# Patient Record
Sex: Female | Born: 1945
Health system: Southern US, Community
[De-identification: ages and names within clinical notes are randomized; demographics above are authoritative.]

## PROBLEM LIST (undated history)

## (undated) DIAGNOSIS — K259 Gastric ulcer, unspecified as acute or chronic, without hemorrhage or perforation: Secondary | ICD-10-CM

## (undated) DIAGNOSIS — I2510 Atherosclerotic heart disease of native coronary artery without angina pectoris: Secondary | ICD-10-CM

## (undated) DIAGNOSIS — T7840XA Allergy, unspecified, initial encounter: Secondary | ICD-10-CM

## (undated) DIAGNOSIS — I10 Essential (primary) hypertension: Secondary | ICD-10-CM

## (undated) DIAGNOSIS — K219 Gastro-esophageal reflux disease without esophagitis: Secondary | ICD-10-CM

## (undated) DIAGNOSIS — K635 Polyp of colon: Secondary | ICD-10-CM

## (undated) DIAGNOSIS — R079 Chest pain, unspecified: Secondary | ICD-10-CM

## (undated) HISTORY — DX: Allergy, unspecified, initial encounter: T78.40XA

## (undated) HISTORY — PX: NASAL SINUS SURGERY: SHX719

## (undated) HISTORY — DX: Chest pain, unspecified: R07.9

---
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
## 1982-12-14 HISTORY — PX: ABDOMINAL HYSTERECTOMY: SHX81

## 1993-12-14 HISTORY — PX: HEMORROIDECTOMY: SUR656

## 1999-12-03 ENCOUNTER — Encounter: Payer: Self-pay | Admitting: Obstetrics and Gynecology

## 1999-12-03 ENCOUNTER — Encounter: Admission: RE | Admit: 1999-12-03 | Discharge: 1999-12-03 | Payer: Self-pay | Admitting: Obstetrics and Gynecology

## 2000-01-20 ENCOUNTER — Other Ambulatory Visit: Admission: RE | Admit: 2000-01-20 | Discharge: 2000-01-20 | Payer: Self-pay | Admitting: Obstetrics and Gynecology

## 2001-01-03 ENCOUNTER — Encounter: Payer: Self-pay | Admitting: Obstetrics and Gynecology

## 2001-01-03 ENCOUNTER — Encounter: Admission: RE | Admit: 2001-01-03 | Discharge: 2001-01-03 | Payer: Self-pay | Admitting: Obstetrics and Gynecology

## 2002-03-22 ENCOUNTER — Encounter: Admission: RE | Admit: 2002-03-22 | Discharge: 2002-03-22 | Payer: Self-pay | Admitting: Obstetrics and Gynecology

## 2002-03-22 ENCOUNTER — Encounter: Payer: Self-pay | Admitting: Obstetrics and Gynecology

## 2003-03-06 ENCOUNTER — Other Ambulatory Visit: Admission: RE | Admit: 2003-03-06 | Discharge: 2003-03-06 | Payer: Self-pay | Admitting: Obstetrics and Gynecology

## 2003-05-25 ENCOUNTER — Encounter: Payer: Self-pay | Admitting: Obstetrics and Gynecology

## 2003-05-25 ENCOUNTER — Encounter: Admission: RE | Admit: 2003-05-25 | Discharge: 2003-05-25 | Payer: Self-pay | Admitting: Obstetrics and Gynecology

## 2004-12-17 ENCOUNTER — Encounter: Admission: RE | Admit: 2004-12-17 | Discharge: 2004-12-17 | Payer: Self-pay | Admitting: Obstetrics and Gynecology

## 2004-12-31 ENCOUNTER — Encounter: Admission: RE | Admit: 2004-12-31 | Discharge: 2004-12-31 | Payer: Self-pay | Admitting: Obstetrics and Gynecology

## 2006-01-26 ENCOUNTER — Ambulatory Visit: Payer: Self-pay | Admitting: Gastroenterology

## 2006-02-22 ENCOUNTER — Ambulatory Visit: Payer: Self-pay | Admitting: Unknown Physician Specialty

## 2006-04-21 ENCOUNTER — Encounter: Admission: RE | Admit: 2006-04-21 | Discharge: 2006-04-21 | Payer: Self-pay | Admitting: Obstetrics and Gynecology

## 2006-05-19 ENCOUNTER — Encounter: Admission: RE | Admit: 2006-05-19 | Discharge: 2006-05-19 | Payer: Self-pay | Admitting: Obstetrics and Gynecology

## 2006-11-02 ENCOUNTER — Ambulatory Visit: Payer: Self-pay | Admitting: Gastroenterology

## 2006-11-17 ENCOUNTER — Ambulatory Visit: Payer: Self-pay | Admitting: Gastroenterology

## 2006-12-30 ENCOUNTER — Ambulatory Visit: Payer: Self-pay | Admitting: Gastroenterology

## 2007-10-12 ENCOUNTER — Encounter: Admission: RE | Admit: 2007-10-12 | Discharge: 2007-10-12 | Payer: Self-pay | Admitting: Family Medicine

## 2009-02-22 ENCOUNTER — Encounter: Admission: RE | Admit: 2009-02-22 | Discharge: 2009-02-22 | Payer: Self-pay | Admitting: Family Medicine

## 2010-12-04 LAB — CBC AND DIFFERENTIAL: Hemoglobin: 12 g/dL (ref 12.0–16.0)

## 2010-12-04 LAB — HEPATIC FUNCTION PANEL: ALT: 12 U/L (ref 7–35)

## 2010-12-14 LAB — HM MAMMOGRAPHY

## 2010-12-14 LAB — HM PAP SMEAR

## 2010-12-16 ENCOUNTER — Ambulatory Visit: Payer: Self-pay | Admitting: Gastroenterology

## 2010-12-18 LAB — PATHOLOGY REPORT

## 2011-01-03 ENCOUNTER — Encounter: Payer: Self-pay | Admitting: Obstetrics and Gynecology

## 2011-01-04 ENCOUNTER — Encounter: Payer: Self-pay | Admitting: Family Medicine

## 2011-01-04 ENCOUNTER — Encounter: Payer: Self-pay | Admitting: Obstetrics and Gynecology

## 2011-03-02 ENCOUNTER — Ambulatory Visit: Payer: Self-pay | Admitting: Internal Medicine

## 2011-08-31 ENCOUNTER — Other Ambulatory Visit (INDEPENDENT_AMBULATORY_CARE_PROVIDER_SITE_OTHER): Payer: Medicare Other | Admitting: *Deleted

## 2011-08-31 ENCOUNTER — Telehealth: Payer: Self-pay | Admitting: Internal Medicine

## 2011-08-31 DIAGNOSIS — N390 Urinary tract infection, site not specified: Secondary | ICD-10-CM

## 2011-08-31 LAB — POCT URINALYSIS DIPSTICK
Glucose, UA: NEGATIVE
Ketones, UA: NEGATIVE
Nitrite, UA: NEGATIVE
Protein, UA: NEGATIVE
Urobilinogen, UA: 0.2

## 2011-08-31 NOTE — Telephone Encounter (Signed)
Patient says that she has been having burning, and urgency for about a week. I scheduled her a lab appt to give urine specimen. I entered the results for the UA. And I have sent it off for culture.

## 2011-08-31 NOTE — Telephone Encounter (Signed)
Please confirm no sulfa allergy and call in Bactrim DS tab po bid x 7 days, disp 14. Thanks

## 2011-08-31 NOTE — Telephone Encounter (Signed)
Pt walked in wanting to be see for uti.  This has been going on for 1 week/rbh

## 2011-09-01 ENCOUNTER — Other Ambulatory Visit: Payer: Self-pay | Admitting: *Deleted

## 2011-09-01 MED ORDER — SULFAMETHOXAZOLE-TRIMETHOPRIM 800-160 MG PO TABS: 1.0000 | ORAL_TABLET | Freq: Two times a day (BID) | ORAL | Status: AC

## 2011-09-01 MED ORDER — SULFAMETHOXAZOLE-TRIMETHOPRIM 800-160 MG PO TABS: 1.0000 | ORAL_TABLET | Freq: Two times a day (BID) | ORAL | Status: DC

## 2011-09-01 NOTE — Telephone Encounter (Signed)
Addended by: Jobie Quaker on: 09/01/2011 12:42 PM   Modules accepted: Orders

## 2011-09-01 NOTE — Telephone Encounter (Signed)
Per patient she has not allergy to sulfa. Rx sent to pharmacy.

## 2011-09-01 NOTE — Telephone Encounter (Signed)
Left message asking patient to return my call.

## 2011-09-03 LAB — URINE CULTURE: Colony Count: 60000

## 2011-12-21 DIAGNOSIS — J301 Allergic rhinitis due to pollen: Secondary | ICD-10-CM | POA: Diagnosis not present

## 2011-12-28 DIAGNOSIS — J301 Allergic rhinitis due to pollen: Secondary | ICD-10-CM | POA: Diagnosis not present

## 2012-01-04 DIAGNOSIS — J301 Allergic rhinitis due to pollen: Secondary | ICD-10-CM | POA: Diagnosis not present

## 2012-01-18 DIAGNOSIS — J301 Allergic rhinitis due to pollen: Secondary | ICD-10-CM | POA: Diagnosis not present

## 2012-01-25 DIAGNOSIS — J301 Allergic rhinitis due to pollen: Secondary | ICD-10-CM | POA: Diagnosis not present

## 2012-02-01 DIAGNOSIS — J301 Allergic rhinitis due to pollen: Secondary | ICD-10-CM | POA: Diagnosis not present

## 2012-02-08 DIAGNOSIS — J301 Allergic rhinitis due to pollen: Secondary | ICD-10-CM | POA: Diagnosis not present

## 2012-02-15 DIAGNOSIS — J301 Allergic rhinitis due to pollen: Secondary | ICD-10-CM | POA: Diagnosis not present

## 2012-02-22 ENCOUNTER — Encounter: Payer: Self-pay | Admitting: Internal Medicine

## 2012-02-22 ENCOUNTER — Ambulatory Visit (INDEPENDENT_AMBULATORY_CARE_PROVIDER_SITE_OTHER): Payer: Medicare Other | Admitting: Internal Medicine

## 2012-02-22 VITALS — BP 122/62 | HR 76 | Temp 98.0°F | Wt 120.0 lb

## 2012-02-22 DIAGNOSIS — N644 Mastodynia: Secondary | ICD-10-CM

## 2012-02-22 DIAGNOSIS — E039 Hypothyroidism, unspecified: Secondary | ICD-10-CM

## 2012-02-22 DIAGNOSIS — D649 Anemia, unspecified: Secondary | ICD-10-CM

## 2012-02-22 DIAGNOSIS — E785 Hyperlipidemia, unspecified: Secondary | ICD-10-CM | POA: Diagnosis not present

## 2012-02-22 DIAGNOSIS — J301 Allergic rhinitis due to pollen: Secondary | ICD-10-CM | POA: Diagnosis not present

## 2012-02-22 NOTE — Progress Notes (Signed)
  Subjective:    Patient ID: Natalie [name], female    DOB: July 25, 1946, 66 y.o.   MRN: [id number]  HPI 66 year old female presents for acute visit complaining of three-week history of right breast pain. She denies any known injury to her breasts. She has no pain at rest but notes pain with movement or pressure on the right breast. The pain starts in the lower lateral right breast and extends upward towards her nipple. There is no overlying skin change or discharge from the nipple. She has palpated a mass in the right lower breast, but reports this is been present for several years. She notes it was evaluated with diagnostic mammogram which was normal.  Outpatient Encounter Prescriptions as of 02/22/2012  Medication Sig Dispense Refill  . aspirin EC 81 MG tablet Take 81 mg by mouth daily.      . budesonide-formoterol (SYMBICORT) 80-4.5 MCG/ACT inhaler Inhale 2 puffs into the lungs 2 (two) times daily as needed.      Marland Kitchen estrogens, conjugated, (PREMARIN) 0.3 MG tablet Take 0.3 mg by mouth daily. Take daily for 21 days then do not take for 7 days.      . fexofenadine (ALLEGRA) 180 MG tablet Take 180 mg by mouth daily.      . fish oil-omega-3 fatty acids 1000 MG capsule Take 2 g by mouth daily.      . fluticasone (FLONASE) 50 MCG/ACT nasal spray Place 2 sprays into the nose daily.      Marland Kitchen omeprazole (PRILOSEC OTC) 20 MG tablet Take 20 mg by mouth daily as needed.      . tiotropium (SPIRIVA) 18 MCG inhalation capsule Place 18 mcg into inhaler and inhale daily as needed.        Review of Systems  Constitutional: Negative for fever, chills and fatigue.  Cardiovascular: Positive for chest pain.  Skin: Negative for color change, pallor, rash and wound.   BP 122/62  Pulse 76  Temp(Src) 98 F (36.7 C) (Oral)  Wt 120 lb (54.432 kg)  SpO2 100%     Objective:   Physical Exam  Constitutional: She appears well-developed and well-nourished. No distress.  HENT:  Head: Normocephalic and atraumatic.    Eyes: EOM are normal.  Neck: Normal range of motion. Neck supple.  Pulmonary/Chest: Effort normal. Right breast exhibits mass and tenderness. Right breast exhibits no inverted nipple, no nipple discharge and no skin change. Left breast exhibits no inverted nipple, no mass, no nipple discharge, no skin change and no tenderness. Breasts are symmetrical.    Skin: She is not diaphoretic.          Assessment & Plan:

## 2012-02-22 NOTE — Assessment & Plan Note (Signed)
Suspect pain secondary to fibrocystic changes. Will get diagnostic mammogram of the right breast for further evaluation. We'll also get screening mammogram of the left breast. Will get ultrasound of the right breast for further evaluation of palpable nodule. However, patient reports that this has been present for several years and has been evaluated by a surgeon in the past. Patient has followup in 2 weeks to discuss results.

## 2012-02-23 LAB — CBC WITH DIFFERENTIAL/PLATELET
Basophils Relative: 0.7 % (ref 0.0–3.0)
Eosinophils Absolute: 0.3 10*3/uL (ref 0.0–0.7)
Eosinophils Relative: 3.2 % (ref 0.0–5.0)
HCT: 35.5 % — ABNORMAL LOW (ref 36.0–46.0)
Hemoglobin: 12.1 g/dL (ref 12.0–15.0)
Lymphocytes Relative: 21.2 % (ref 12.0–46.0)
Monocytes Absolute: 0.7 10*3/uL (ref 0.1–1.0)
Monocytes Relative: 8.3 % (ref 3.0–12.0)
Neutro Abs: 5.5 10*3/uL (ref 1.4–7.7)
Neutrophils Relative %: 66.6 % (ref 43.0–77.0)
Platelets: 390 10*3/uL (ref 150.0–400.0)

## 2012-02-23 LAB — COMPREHENSIVE METABOLIC PANEL
ALT: 15 U/L (ref 0–35)
AST: 21 U/L (ref 0–37)
BUN: 13 mg/dL (ref 6–23)
Calcium: 9.4 mg/dL (ref 8.4–10.5)
Glucose, Bld: 89 mg/dL (ref 70–99)

## 2012-02-23 LAB — LIPID PANEL
Cholesterol: 184 mg/dL (ref 0–200)
LDL Cholesterol: 87 mg/dL (ref 0–99)
Triglycerides: 84 mg/dL (ref 0.0–149.0)

## 2012-02-23 LAB — TSH: TSH: 1.7 u[IU]/mL (ref 0.35–5.50)

## 2012-02-24 ENCOUNTER — Telehealth: Payer: Self-pay | Admitting: *Deleted

## 2012-02-24 DIAGNOSIS — N644 Mastodynia: Secondary | ICD-10-CM

## 2012-02-24 NOTE — Telephone Encounter (Signed)
New mammogram order

## 2012-02-26 ENCOUNTER — Encounter: Payer: Self-pay | Admitting: Internal Medicine

## 2012-02-26 DIAGNOSIS — J339 Nasal polyp, unspecified: Secondary | ICD-10-CM | POA: Insufficient documentation

## 2012-02-29 DIAGNOSIS — J301 Allergic rhinitis due to pollen: Secondary | ICD-10-CM | POA: Diagnosis not present

## 2012-03-03 ENCOUNTER — Ambulatory Visit: Payer: Self-pay | Admitting: Internal Medicine

## 2012-03-03 DIAGNOSIS — N644 Mastodynia: Secondary | ICD-10-CM | POA: Diagnosis not present

## 2012-03-03 DIAGNOSIS — R928 Other abnormal and inconclusive findings on diagnostic imaging of breast: Secondary | ICD-10-CM | POA: Diagnosis not present

## 2012-03-03 DIAGNOSIS — N63 Unspecified lump in unspecified breast: Secondary | ICD-10-CM | POA: Diagnosis not present

## 2012-03-03 DIAGNOSIS — J301 Allergic rhinitis due to pollen: Secondary | ICD-10-CM | POA: Diagnosis not present

## 2012-03-04 ENCOUNTER — Telehealth: Payer: Self-pay | Admitting: Internal Medicine

## 2012-03-04 NOTE — Telephone Encounter (Signed)
[place] of right breast was normal

## 2012-03-07 ENCOUNTER — Encounter: Payer: Medicare Other | Admitting: Internal Medicine

## 2012-03-07 DIAGNOSIS — J301 Allergic rhinitis due to pollen: Secondary | ICD-10-CM | POA: Diagnosis not present

## 2012-03-18 ENCOUNTER — Encounter: Payer: Self-pay | Admitting: Internal Medicine

## 2012-03-21 DIAGNOSIS — J301 Allergic rhinitis due to pollen: Secondary | ICD-10-CM | POA: Diagnosis not present

## 2012-03-28 DIAGNOSIS — J301 Allergic rhinitis due to pollen: Secondary | ICD-10-CM | POA: Diagnosis not present

## 2012-04-04 DIAGNOSIS — J301 Allergic rhinitis due to pollen: Secondary | ICD-10-CM | POA: Diagnosis not present

## 2012-04-11 DIAGNOSIS — J301 Allergic rhinitis due to pollen: Secondary | ICD-10-CM | POA: Diagnosis not present

## 2012-04-18 DIAGNOSIS — J301 Allergic rhinitis due to pollen: Secondary | ICD-10-CM | POA: Diagnosis not present

## 2012-04-26 ENCOUNTER — Encounter: Payer: Self-pay | Admitting: Internal Medicine

## 2012-04-27 ENCOUNTER — Encounter: Payer: Self-pay | Admitting: Internal Medicine

## 2012-04-27 ENCOUNTER — Ambulatory Visit (INDEPENDENT_AMBULATORY_CARE_PROVIDER_SITE_OTHER): Payer: Medicare Other | Admitting: Internal Medicine

## 2012-04-27 DIAGNOSIS — M8580 Other specified disorders of bone density and structure, unspecified site: Secondary | ICD-10-CM | POA: Insufficient documentation

## 2012-04-27 DIAGNOSIS — N951 Menopausal and female climacteric states: Secondary | ICD-10-CM

## 2012-04-27 DIAGNOSIS — M949 Disorder of cartilage, unspecified: Secondary | ICD-10-CM

## 2012-04-27 DIAGNOSIS — Z8041 Family history of malignant neoplasm of ovary: Secondary | ICD-10-CM

## 2012-04-27 DIAGNOSIS — Z0000 Encounter for general adult medical examination without abnormal findings: Secondary | ICD-10-CM

## 2012-04-27 DIAGNOSIS — M899 Disorder of bone, unspecified: Secondary | ICD-10-CM

## 2012-04-27 LAB — HM MAMMOGRAPHY: HM Mammogram: NORMAL

## 2012-04-27 MED ORDER — ESTROGENS CONJUGATED 0.3 MG PO TABS: 0.3000 mg | ORAL_TABLET | Freq: Every day | ORAL | Status: DC

## 2012-04-27 NOTE — Assessment & Plan Note (Signed)
Discussed the risk and benefits of estrogen supplementation. She will continue on Premarin 0.3 mg daily. She will also use topical Premarin cream to help with vaginal dryness.

## 2012-04-27 NOTE — Assessment & Plan Note (Signed)
General exam including breast exam is normal today. Health maintenance including mammogram, Pap smear, colonoscopy, vaccinations are up to date. Lab work is up to date. Will schedule bone density testing. We'll also schedule pelvic ultrasound given patient's family history of ovarian cancer. Patient will followup in 6 months or sooner if needed.

## 2012-04-27 NOTE — Progress Notes (Signed)
Subjective:    Patient ID: Natalie [name], female    DOB: Sep 13, 1946, 66 y.o.   MRN: [id number]  HPI The patient is here for annual Medicare wellness examination and management of other chronic and acute problems.   The risk factors are reflected in the social history.  The roster of all physicians providing medical care to patient - is listed in the Snapshot section of the chart.  Activities of daily living:  The patient is 100% independent in all ADLs: dressing, toileting, feeding as well as independent mobility  Home safety : The patient has smoke detectors in the home. They wear seatbelts.  There are firearms at home. There is no violence in the home.   There is no risks for hepatitis, STDs or HIV. There is no history of blood transfusion. They have no travel history to infectious disease endemic areas of the world.  The patient has seen their dentist in the last six month (Dr. Alvy Bimler). They have seen their eye doctor in the last two years [facility]). No problems with hearing. They do not  have excessive sun exposure. Discussed the need for sun protection: hats, long sleeves and use of sunscreen if there is significant sun exposure.   Diet: the importance of a healthy diet is discussed. They do have a healthy diet.  The benefits of regular aerobic exercise were discussed. She takes aerobic classes several times per week.  Depression screen: there are no signs or vegative symptoms of depression- irritability, change in appetite, anhedonia, sadness/tearfullness.  Cognitive assessment: the patient manages all their financial and personal affairs and is actively engaged. They could relate day,date,year and events.  The following portions of the patient's history were reviewed and updated as appropriate: allergies, current medications, past family history, past medical history,  past surgical history, past social history  and problem list.  Visual acuity was not assessed as she  has regular follow up with her ophthalmologist. Hearing and body mass index were assessed and reviewed.   During the course of the visit the patient was educated and counseled about appropriate screening and preventive services including : fall prevention , diabetes screening, nutrition counseling, colorectal cancer screening, and recommended immunizations.    Given her family history of ovarian cancer in her daughter, we discussed the option of pelvic ultrasound for screening for ovarian cancer. She would like to proceed with this.  She is also concerned today about ongoing hot flashes. She has tried tapering her oral estrogen in an effort to reduce her risk of estrogen exposure, however she continues to have hot flashes abnormal sig daily basis. She also has vaginal dryness. She occasionally uses Premarin for this. She questions the safety of these medications.   Outpatient Encounter Prescriptions as of 04/27/2012  Medication Sig Dispense Refill  . aspirin EC 81 MG tablet Take 81 mg by mouth daily.      . budesonide-formoterol (SYMBICORT) 80-4.5 MCG/ACT inhaler Inhale 2 puffs into the lungs 2 (two) times daily as needed.      Marland Kitchen estrogens, conjugated, (PREMARIN) 0.3 MG tablet Take 1 tablet (0.3 mg total) by mouth daily.  90 tablet  4  . fexofenadine (ALLEGRA) 180 MG tablet Take 180 mg by mouth daily.      . fish oil-omega-3 fatty acids 1000 MG capsule Take 2 g by mouth daily.      . fluticasone (FLONASE) 50 MCG/ACT nasal spray Place 2 sprays into the nose daily.      Marland Kitchen omeprazole (  PRILOSEC OTC) 20 MG tablet Take 20 mg by mouth daily as needed.      . tiotropium (SPIRIVA) 18 MCG inhalation capsule Place 18 mcg into inhaler and inhale daily as needed.      Marland Kitchen DISCONTD: estrogens, conjugated, (PREMARIN) 0.3 MG tablet Take 0.3 mg by mouth daily. Take daily for 21 days then do not take for 7 days.        Review of Systems  Constitutional: Positive for diaphoresis. Negative for fever, chills,  appetite change, fatigue and unexpected weight change.  HENT: Negative for ear pain, congestion, sore throat, trouble swallowing, neck pain, voice change and sinus pressure.   Eyes: Negative for visual disturbance.  Respiratory: Negative for cough, shortness of breath, wheezing and stridor.   Cardiovascular: Negative for chest pain, palpitations and leg swelling.  Gastrointestinal: Negative for nausea, vomiting, abdominal pain, diarrhea, constipation, blood in stool, abdominal distention and anal bleeding.  Genitourinary: Positive for dyspareunia. Negative for dysuria and flank pain.  Musculoskeletal: Negative for myalgias, arthralgias and gait problem.  Skin: Negative for color change and rash.  Neurological: Negative for dizziness and headaches.  Hematological: Negative for adenopathy. Does not bruise/bleed easily.  Psychiatric/Behavioral: Negative for suicidal ideas, sleep disturbance and dysphoric mood. The patient is not nervous/anxious.    BP 109/67  Pulse 79  Temp(Src) 98.1 F (36.7 C) (Oral)  Resp 14  Ht 4' 11.25" (1.505 m)  Wt 120 lb 8 oz (54.658 kg)  BMI 24.13 kg/m2  SpO2 98%     Objective:   Physical Exam  Constitutional: She is oriented to person, place, and time. She appears well-developed and well-nourished. No distress.  HENT:  Head: Normocephalic and atraumatic.  Right Ear: External ear normal.  Left Ear: External ear normal.  Nose: Nose normal.  Mouth/Throat: Oropharynx is clear and moist. No oropharyngeal exudate.  Eyes: Conjunctivae are normal. Pupils are equal, round, and reactive to light. Right eye exhibits no discharge. Left eye exhibits no discharge. No scleral icterus.  Neck: Normal range of motion. Neck supple. No tracheal deviation present. No thyromegaly present.  Cardiovascular: Normal rate, regular rhythm, normal heart sounds and intact distal pulses.  Exam reveals no gallop and no friction rub.   No murmur heard. Pulmonary/Chest: Effort normal and  breath sounds normal. No respiratory distress. She has no wheezes. She has no rales. She exhibits no tenderness. Right breast exhibits no inverted nipple, no mass, no nipple discharge, no skin change and no tenderness. Left breast exhibits no inverted nipple, no mass, no nipple discharge, no skin change and no tenderness. Breasts are symmetrical.    Abdominal: Soft. Bowel sounds are normal. She exhibits no distension and no mass. There is no tenderness. There is no guarding.  Musculoskeletal: Normal range of motion. She exhibits no edema and no tenderness.  Lymphadenopathy:    She has no cervical adenopathy.  Neurological: She is alert and oriented to person, place, and time. No cranial nerve deficit. She exhibits normal muscle tone. Coordination normal.  Skin: Skin is warm and dry. No rash noted. She is not diaphoretic. No erythema. No pallor.  Psychiatric: She has a normal mood and affect. Her behavior is normal. Judgment and thought content normal.          Assessment & Plan:

## 2012-05-02 ENCOUNTER — Ambulatory Visit: Payer: Self-pay | Admitting: Internal Medicine

## 2012-05-02 DIAGNOSIS — Z8041 Family history of malignant neoplasm of ovary: Secondary | ICD-10-CM | POA: Diagnosis not present

## 2012-05-02 DIAGNOSIS — N949 Unspecified condition associated with female genital organs and menstrual cycle: Secondary | ICD-10-CM | POA: Diagnosis not present

## 2012-05-02 DIAGNOSIS — R9389 Abnormal findings on diagnostic imaging of other specified body structures: Secondary | ICD-10-CM | POA: Diagnosis not present

## 2012-05-02 DIAGNOSIS — J301 Allergic rhinitis due to pollen: Secondary | ICD-10-CM | POA: Diagnosis not present

## 2012-05-03 ENCOUNTER — Telehealth: Payer: Self-pay | Admitting: Internal Medicine

## 2012-05-03 NOTE — Telephone Encounter (Signed)
Patient has been informed via My Chart message; order has been placed for imaging.

## 2012-05-03 NOTE — Telephone Encounter (Signed)
Need to set up pelvic CT with contrast to further look at the right ovary, as this was poorly visualized on [place].

## 2012-05-04 ENCOUNTER — Other Ambulatory Visit: Payer: Self-pay | Admitting: Diagnostic Radiology

## 2012-05-04 DIAGNOSIS — Z1389 Encounter for screening for other disorder: Secondary | ICD-10-CM | POA: Diagnosis not present

## 2012-05-04 LAB — CREATININE, SERUM
EGFR (African American): >60
EGFR (Non-African Amer.): >60

## 2012-05-05 ENCOUNTER — Ambulatory Visit: Payer: Self-pay | Admitting: Internal Medicine

## 2012-05-05 DIAGNOSIS — R935 Abnormal findings on diagnostic imaging of other abdominal regions, including retroperitoneum: Secondary | ICD-10-CM | POA: Diagnosis not present

## 2012-05-06 ENCOUNTER — Telehealth: Payer: Self-pay | Admitting: Internal Medicine

## 2012-05-06 NOTE — Telephone Encounter (Signed)
Informed patient/SLS 

## 2012-05-06 NOTE — Telephone Encounter (Signed)
The CT of the abdomen and pelvis ordered by Dr. Dan Humphreys on Natalie [name] did not show any abnormality  On the ovaries.  There was a small cyst on the liver which is incidental and nothing to worry about.  Follow up with Dr. Dan Humphreys as previously discussed.

## 2012-05-11 ENCOUNTER — Encounter: Payer: Self-pay | Admitting: Internal Medicine

## 2012-05-16 DIAGNOSIS — J301 Allergic rhinitis due to pollen: Secondary | ICD-10-CM | POA: Diagnosis not present

## 2012-05-17 ENCOUNTER — Encounter: Payer: Self-pay | Admitting: Internal Medicine

## 2012-05-20 DIAGNOSIS — J330 Polyp of nasal cavity: Secondary | ICD-10-CM | POA: Diagnosis not present

## 2012-05-20 DIAGNOSIS — J301 Allergic rhinitis due to pollen: Secondary | ICD-10-CM | POA: Diagnosis not present

## 2012-05-23 DIAGNOSIS — J330 Polyp of nasal cavity: Secondary | ICD-10-CM | POA: Diagnosis not present

## 2012-05-23 DIAGNOSIS — J301 Allergic rhinitis due to pollen: Secondary | ICD-10-CM | POA: Diagnosis not present

## 2012-05-30 DIAGNOSIS — J301 Allergic rhinitis due to pollen: Secondary | ICD-10-CM | POA: Diagnosis not present

## 2012-06-06 DIAGNOSIS — J301 Allergic rhinitis due to pollen: Secondary | ICD-10-CM | POA: Diagnosis not present

## 2012-06-13 DIAGNOSIS — J301 Allergic rhinitis due to pollen: Secondary | ICD-10-CM | POA: Diagnosis not present

## 2012-06-27 DIAGNOSIS — J301 Allergic rhinitis due to pollen: Secondary | ICD-10-CM | POA: Diagnosis not present

## 2012-06-28 ENCOUNTER — Other Ambulatory Visit: Payer: Self-pay | Admitting: Internal Medicine

## 2012-07-08 ENCOUNTER — Telehealth: Payer: Self-pay | Admitting: Internal Medicine

## 2012-07-08 MED ORDER — SULFAMETHOXAZOLE-TMP DS 800-160 MG PO TABS: 1.0000 | ORAL_TABLET | Freq: Two times a day (BID) | ORAL | Status: DC

## 2012-07-08 NOTE — Telephone Encounter (Signed)
Rx sent to [facility], [place] patient notified via telephone.

## 2012-07-08 NOTE — Telephone Encounter (Signed)
Caller: Natalie [name]/Patient; PCP: Ronna Polio; CB#: [phone]; ; ; Call regarding Urinary Symptoms; onset 07/07/12.  Afebrile.  States has spasms, urgency, and frequency.  In the past, has had symptoms which responded well to Bactrim DS.  Denies fever, blood in urine, back pain, or other emergent symptoms per protocol; advised appt within 24 hours.  Declines appt; states is out of town and prefers Rx be called in.  Info to office for provider review/Rx/callback.  May call Rx in to [facility], [place] [phone].   MAY REACH PATIENT AT [phone].

## 2012-07-08 NOTE — Telephone Encounter (Signed)
Ok to call in [place] DS one tablet bid  #10 no refills to out of town pharmacy

## 2012-07-11 DIAGNOSIS — J301 Allergic rhinitis due to pollen: Secondary | ICD-10-CM | POA: Diagnosis not present

## 2012-07-18 DIAGNOSIS — J301 Allergic rhinitis due to pollen: Secondary | ICD-10-CM | POA: Diagnosis not present

## 2012-07-21 ENCOUNTER — Encounter: Payer: Self-pay | Admitting: Internal Medicine

## 2012-07-25 DIAGNOSIS — J301 Allergic rhinitis due to pollen: Secondary | ICD-10-CM | POA: Diagnosis not present

## 2012-08-01 DIAGNOSIS — J301 Allergic rhinitis due to pollen: Secondary | ICD-10-CM | POA: Diagnosis not present

## 2012-08-05 DIAGNOSIS — J301 Allergic rhinitis due to pollen: Secondary | ICD-10-CM | POA: Diagnosis not present

## 2012-08-22 DIAGNOSIS — J301 Allergic rhinitis due to pollen: Secondary | ICD-10-CM | POA: Diagnosis not present

## 2012-08-24 DIAGNOSIS — J301 Allergic rhinitis due to pollen: Secondary | ICD-10-CM | POA: Diagnosis not present

## 2012-08-29 DIAGNOSIS — J301 Allergic rhinitis due to pollen: Secondary | ICD-10-CM | POA: Diagnosis not present

## 2012-09-05 DIAGNOSIS — J301 Allergic rhinitis due to pollen: Secondary | ICD-10-CM | POA: Diagnosis not present

## 2012-09-12 DIAGNOSIS — J301 Allergic rhinitis due to pollen: Secondary | ICD-10-CM | POA: Diagnosis not present

## 2012-09-30 ENCOUNTER — Encounter: Payer: Self-pay | Admitting: Internal Medicine

## 2012-09-30 ENCOUNTER — Ambulatory Visit (INDEPENDENT_AMBULATORY_CARE_PROVIDER_SITE_OTHER): Payer: Medicare Other | Admitting: Internal Medicine

## 2012-09-30 ENCOUNTER — Telehealth: Payer: Self-pay | Admitting: Internal Medicine

## 2012-09-30 VITALS — BP 146/82 | HR 75 | Temp 97.8°F | Ht 59.25 in | Wt 120.8 lb

## 2012-09-30 DIAGNOSIS — R079 Chest pain, unspecified: Secondary | ICD-10-CM

## 2012-09-30 DIAGNOSIS — R030 Elevated blood-pressure reading, without diagnosis of hypertension: Secondary | ICD-10-CM | POA: Diagnosis not present

## 2012-09-30 DIAGNOSIS — E039 Hypothyroidism, unspecified: Secondary | ICD-10-CM | POA: Diagnosis not present

## 2012-09-30 DIAGNOSIS — IMO0001 Reserved for inherently not codable concepts without codable children: Secondary | ICD-10-CM | POA: Insufficient documentation

## 2012-09-30 LAB — POCT URINALYSIS DIPSTICK
Leukocytes, UA: NEGATIVE
Protein, UA: NEGATIVE
Urobilinogen, UA: 0.2

## 2012-09-30 LAB — CBC WITH DIFFERENTIAL/PLATELET
Eosinophils Relative: 1.1 % (ref 0.0–5.0)
HCT: 39.7 % (ref 36.0–46.0)
Hemoglobin: 13 g/dL (ref 12.0–15.0)
Lymphs Abs: 2.4 10*3/uL (ref 0.7–4.0)
Monocytes Relative: 4.4 % (ref 3.0–12.0)
Neutro Abs: 7.5 10*3/uL (ref 1.4–7.7)
RDW: 12.9 % (ref 11.5–14.6)

## 2012-09-30 LAB — COMPREHENSIVE METABOLIC PANEL
CO2: 31 mEq/L (ref 19–32)
Creatinine, Ser: 0.8 mg/dL (ref 0.4–1.2)
GFR: 71.8 mL/min (ref >60.00)
Glucose, Bld: 108 mg/dL — ABNORMAL HIGH (ref 70–99)
Total Bilirubin: 0.3 mg/dL (ref 0.3–1.2)

## 2012-09-30 LAB — TSH: TSH: 0.99 u[IU]/mL (ref 0.35–5.50)

## 2012-09-30 NOTE — Assessment & Plan Note (Addendum)
Patient presents with several week history of substernal chest pressure which occurs after eating with associated palpitations. Symptoms are most consistent with uncontrolled acid reflux, however recent EGD normal per pt. Will check H. Pylori breath test. Will continue PPI.Other considerations include hyperthyroidism with palpitations, however TSH and free T4 are normal today. Myocardial ischemia would also be a consideration however symptoms are atypical for this and EKG is normal. Patient also recently had normal stress testing. Less likely consideration would also include pheochromocytoma or other endocrine tumor, however this seems unlikely in absence of lower GI symptoms. Will check urine and plasma catecholamines. Follow up 2 weeks.

## 2012-09-30 NOTE — Assessment & Plan Note (Signed)
Blood pressure is elevated today which is unusual for this patient. Question if this may be related to anxiety related to recent symptoms. We'll plan to recheck in 2 weeks. Patient will monitor her blood pressure at home.

## 2012-09-30 NOTE — Telephone Encounter (Signed)
Needs breath test H. Pylori

## 2012-09-30 NOTE — Progress Notes (Signed)
Subjective:    Patient ID: Natalie [name], female    DOB: 10-01-1946, 66 y.o.   MRN: [id number]  HPI 66 year old female presents for acute visit complaining of 2 week history of intermittent episodes of chest tightness, palpitations which occur after eating. She denies any nausea or abdominal pain. She denies any change in bowel habits including diarrhea or constipation. She notes that she recently had an EGD which was normal. She reports compliance with her pantoprazole. Episodes occur with various foods with no particular pattern. He typically resolve without any intervention after a few minutes. They are not associated with shortness of breath or diaphoresis. 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 She has had a stress test in the past which was normal. She denies use of any new medications or supplements. She denies any recent stressors in her life.  Outpatient Encounter Prescriptions as of 09/30/2012  Medication Sig Dispense Refill  . aspirin EC 81 MG tablet Take 81 mg by mouth daily.      . budesonide-formoterol (SYMBICORT) 80-4.5 MCG/ACT inhaler Inhale 2 puffs into the lungs 2 (two) times daily as needed.      Marland Kitchen estrogens, conjugated, (PREMARIN) 0.3 MG tablet Take 1 tablet (0.3 mg total) by mouth daily.  90 tablet  4  . fexofenadine (ALLEGRA) 180 MG tablet Take 180 mg by mouth daily.      . fish oil-omega-3 fatty acids 1000 MG capsule Take 2 g by mouth daily.      . fluticasone (FLONASE) 50 MCG/ACT nasal spray Place 2 sprays into the nose daily.      Marland Kitchen omeprazole (PRILOSEC OTC) 20 MG tablet Take 20 mg by mouth daily as needed.      . pantoprazole (PROTONIX) 40 MG tablet TAKE 1 TABLET BY MOUTH ONCE A DAY  90 tablet  3  . sulfamethoxazole-trimethoprim (BACTRIM DS) 800-160 MG per tablet Take 1 tablet by mouth 2 (two) times daily.  10 tablet  0  . tiotropium (SPIRIVA)  18 MCG inhalation capsule Place 18 mcg into inhaler and inhale daily as needed.       BP 146/82  Pulse 75  Temp 97.8 F (36.6 C) (Oral)  Ht 4' 11.25" (1.505 m)  Wt 120 lb 12 oz (54.772 kg)  BMI 24.18 kg/m2  SpO2 99%  Review of Systems  Constitutional: Negative for fever, chills, appetite change, fatigue and unexpected weight change.  HENT: Negative for ear pain, congestion, sore throat, trouble swallowing, neck pain, voice change and sinus pressure.   Eyes: Negative for visual disturbance.  Respiratory: Positive for chest tightness. Negative for cough, shortness of breath, wheezing and stridor.   Cardiovascular: Positive for chest pain and palpitations. Negative for leg swelling.  Gastrointestinal: Negative for nausea, vomiting, abdominal pain, diarrhea, constipation, blood in stool, abdominal distention and anal bleeding.  Genitourinary: Negative for dysuria and flank pain.  Musculoskeletal: Negative for myalgias, arthralgias and gait problem.  Skin: Negative for color change and rash.  Neurological: Negative for dizziness and headaches.  Hematological: Negative for adenopathy. Does not bruise/bleed easily.  Psychiatric/Behavioral: Negative for suicidal ideas, disturbed wake/sleep cycle and dysphoric mood. The patient is not nervous/anxious.        Objective:   Physical Exam  Constitutional: She is oriented to person, place, and time. She appears well-developed and well-nourished. No distress.  HENT:  Head: Normocephalic and atraumatic.  Right Ear: External ear normal.  Left Ear: External ear normal.  Nose: Nose normal.  Mouth/Throat: Oropharynx is clear and moist. No oropharyngeal exudate.  Eyes: Conjunctivae normal are normal. Pupils are equal, round, and reactive to light. Right eye exhibits no discharge. Left eye exhibits no discharge. No scleral icterus.  Neck: Normal range of motion. Neck supple. No tracheal deviation present. No thyromegaly present.  Cardiovascular:  Normal rate, regular rhythm, normal heart sounds and intact distal pulses.  Exam reveals no gallop and no friction rub.   No murmur heard. Pulmonary/Chest: Effort normal and breath sounds normal. No respiratory distress. She has no wheezes. She has no rales. She exhibits no tenderness.  Musculoskeletal: Normal range of motion. She exhibits no edema and no tenderness.  Lymphadenopathy:    She has no cervical adenopathy.  Neurological: She is alert and oriented to person, place, and time. No cranial nerve deficit. She exhibits normal muscle tone. Coordination normal.  Skin: Skin is warm and dry. No rash noted. She is not diaphoretic. No erythema. No pallor.  Psychiatric: She has a normal mood and affect. Her behavior is normal. Judgment and thought content normal.          Assessment & Plan:

## 2012-10-03 DIAGNOSIS — J301 Allergic rhinitis due to pollen: Secondary | ICD-10-CM | POA: Diagnosis not present

## 2012-10-06 ENCOUNTER — Ambulatory Visit: Payer: Self-pay | Admitting: Internal Medicine

## 2012-10-06 DIAGNOSIS — M899 Disorder of bone, unspecified: Secondary | ICD-10-CM | POA: Diagnosis not present

## 2012-10-06 DIAGNOSIS — M949 Disorder of cartilage, unspecified: Secondary | ICD-10-CM | POA: Diagnosis not present

## 2012-10-07 ENCOUNTER — Telehealth: Payer: Self-pay | Admitting: Internal Medicine

## 2012-10-07 NOTE — Telephone Encounter (Signed)
Patient called in and stated that she started the specimen collection on Thursday 10/06/12 at 7:00 am.

## 2012-10-10 ENCOUNTER — Encounter: Payer: Self-pay | Admitting: Internal Medicine

## 2012-10-10 DIAGNOSIS — J301 Allergic rhinitis due to pollen: Secondary | ICD-10-CM | POA: Diagnosis not present

## 2012-10-11 ENCOUNTER — Telehealth: Payer: Self-pay | Admitting: Internal Medicine

## 2012-10-11 NOTE — Telephone Encounter (Signed)
Natalie [name] spoke with someone from the lab and advised that the test takes 3-5 days and was set up on Saturday.  Patient was advised that she can expect results tomorrow through Friday of this week and we will keep her posted.

## 2012-10-11 NOTE — Telephone Encounter (Signed)
Left message on machine at home for patient to return call. 

## 2012-10-11 NOTE — Telephone Encounter (Signed)
Patient called to check on the results of the 24 hour urine, no results are in the computer.  I checked with Asher Muir in the lab and she will call to check on the results and get back with me.

## 2012-10-11 NOTE — Telephone Encounter (Signed)
Pt. Checking on lab results.

## 2012-10-12 LAB — METANEPHRINES, URINE, 24 HOUR: Metanephrines, Ur: 98 mcg/24 h (ref 90–315)

## 2012-10-17 ENCOUNTER — Other Ambulatory Visit: Payer: Medicare Other

## 2012-10-17 DIAGNOSIS — J301 Allergic rhinitis due to pollen: Secondary | ICD-10-CM | POA: Diagnosis not present

## 2012-10-18 ENCOUNTER — Other Ambulatory Visit: Payer: Medicare Other

## 2012-10-19 ENCOUNTER — Other Ambulatory Visit: Payer: Medicare Other

## 2012-10-19 ENCOUNTER — Other Ambulatory Visit: Payer: Self-pay | Admitting: *Deleted

## 2012-10-19 DIAGNOSIS — R079 Chest pain, unspecified: Secondary | ICD-10-CM

## 2012-10-20 ENCOUNTER — Other Ambulatory Visit (INDEPENDENT_AMBULATORY_CARE_PROVIDER_SITE_OTHER): Payer: Medicare Other

## 2012-10-20 DIAGNOSIS — R079 Chest pain, unspecified: Secondary | ICD-10-CM | POA: Diagnosis not present

## 2012-10-21 DIAGNOSIS — R079 Chest pain, unspecified: Secondary | ICD-10-CM | POA: Diagnosis not present

## 2012-10-21 DIAGNOSIS — J301 Allergic rhinitis due to pollen: Secondary | ICD-10-CM | POA: Diagnosis not present

## 2012-10-24 DIAGNOSIS — J301 Allergic rhinitis due to pollen: Secondary | ICD-10-CM | POA: Diagnosis not present

## 2012-10-26 LAB — CATECHOLAMINES, FRACTIONATED, PLASMA: Norepinephrine: 596 pg/mL

## 2012-10-27 LAB — CATECHOLAMINES, FRACTIONATED, URINE, 24 HOUR
Calculated Total (E+NE): 20 mcg/24 h — ABNORMAL LOW (ref 26–121)
Creatinine, Urine mg/day-CATEUR: 0.5 g/(24.h) — ABNORMAL LOW (ref 0.63–2.50)
Dopamine, 24 hr Urine: 141 mcg/24 h (ref 52–480)

## 2012-10-31 ENCOUNTER — Ambulatory Visit: Payer: Medicare Other | Admitting: Internal Medicine

## 2012-10-31 DIAGNOSIS — J301 Allergic rhinitis due to pollen: Secondary | ICD-10-CM | POA: Diagnosis not present

## 2012-11-07 DIAGNOSIS — J301 Allergic rhinitis due to pollen: Secondary | ICD-10-CM | POA: Diagnosis not present

## 2012-11-08 ENCOUNTER — Ambulatory Visit (INDEPENDENT_AMBULATORY_CARE_PROVIDER_SITE_OTHER): Payer: Medicare Other | Admitting: Internal Medicine

## 2012-11-08 ENCOUNTER — Encounter: Payer: Self-pay | Admitting: Internal Medicine

## 2012-11-08 VITALS — BP 110/80 | HR 65 | Temp 97.8°F | Resp 16 | Wt 123.2 lb

## 2012-11-08 DIAGNOSIS — R079 Chest pain, unspecified: Secondary | ICD-10-CM | POA: Diagnosis not present

## 2012-11-08 DIAGNOSIS — R0609 Other forms of dyspnea: Secondary | ICD-10-CM | POA: Insufficient documentation

## 2012-11-08 DIAGNOSIS — K219 Gastro-esophageal reflux disease without esophagitis: Secondary | ICD-10-CM | POA: Diagnosis not present

## 2012-11-08 DIAGNOSIS — Z23 Encounter for immunization: Secondary | ICD-10-CM | POA: Diagnosis not present

## 2012-11-08 DIAGNOSIS — R0989 Other specified symptoms and signs involving the circulatory and respiratory systems: Secondary | ICD-10-CM | POA: Diagnosis not present

## 2012-11-08 NOTE — Progress Notes (Signed)
Subjective:    Patient ID: Natalie [name], female    DOB: November 24, 1946, 66 y.o.   MRN: [id number]  HPI 66 year old female with history of GERD and asthma presents for followup after recent episodes of anterior chest tightness and dyspnea on exertion. At her last visit, she complained of several week history of intermittent episodes of chest tightness and dyspnea with exertion. She also complained of generalized fatigue. 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 Occasionally, during his classes or with exertion such as walking up a flight of stairs she developed some chest tightness or shortness of breath. She had also at that time noted that her blood pressure had been more elevated than previous. She noted that episode seemed more frequent after eating certain foods. She also noted occasional flushing. Evaluation including EKG, lab work, including testing for rare condition such as pheochromocytoma were all negative. Her symptoms have persisted. Today we reviewed that she had a nuclear stress test approximately 2 years ago. She reports this was normal. She does have a family history of heart disease. She is concerned that her symptoms may be related to heart disease. We also considered the possibility of acid reflux leading to her symptoms and she underwent upper endoscopy which was normal.  Outpatient Encounter Prescriptions as of 11/08/2012  Medication Sig Dispense Refill  . aspirin EC 81 MG tablet Take 81 mg by mouth daily.      . budesonide-formoterol (SYMBICORT) 80-4.5 MCG/ACT inhaler Inhale 2 puffs into the lungs 2 (two) times daily as needed.      Marland Kitchen estrogens, conjugated, (PREMARIN) 0.3 MG tablet Take 1 tablet (0.3 mg total) by mouth daily.  90 tablet  4  . fexofenadine (ALLEGRA) 180 MG tablet Take 180 mg by mouth daily.      . fish oil-omega-3 fatty acids 1000 MG capsule Take 2 g by mouth daily.      . fluticasone (FLONASE) 50 MCG/ACT nasal spray Place 2 sprays  into the nose daily.      . pantoprazole (PROTONIX) 40 MG tablet TAKE 1 TABLET BY MOUTH ONCE A DAY  90 tablet  3  . omeprazole (PRILOSEC OTC) 20 MG tablet Take 20 mg by mouth daily as needed.      . sulfamethoxazole-trimethoprim (BACTRIM DS) 800-160 MG per tablet Take 1 tablet by mouth 2 (two) times daily.  10 tablet  0  . tiotropium (SPIRIVA) 18 MCG inhalation capsule Place 18 mcg into inhaler and inhale daily as needed.       BP 110/80  Pulse 65  Temp 97.8 F (36.6 C) (Oral)  Resp 16  Wt 123 lb 4 oz (55.906 kg)  Review of Systems  Constitutional: Positive for fatigue. Negative for fever, chills, appetite change and unexpected weight change.  HENT: Negative for ear pain, congestion, sore throat, trouble swallowing, neck pain, voice change and sinus pressure.   Eyes: Negative for visual disturbance.  Respiratory: Positive for chest tightness and shortness of breath. Negative for cough, wheezing and stridor.   Cardiovascular: Positive for chest pain. Negative for palpitations and leg swelling.  Gastrointestinal: Negative for nausea, vomiting, abdominal pain, diarrhea, constipation, blood in stool, abdominal distention and anal bleeding.  Genitourinary: Negative for dysuria and flank pain.  Musculoskeletal: Negative for myalgias, arthralgias and gait problem.  Skin: Negative for color change and rash.  Neurological: Negative for dizziness and headaches.  Hematological: Negative for adenopathy. Does not bruise/bleed easily.  Psychiatric/Behavioral: Negative for suicidal ideas, sleep  disturbance and dysphoric mood. The patient is not nervous/anxious.        Objective:   Physical Exam  Constitutional: She is oriented to person, place, and time. She appears well-developed and well-nourished. No distress.  HENT:  Head: Normocephalic and atraumatic.  Right Ear: External ear normal.  Left Ear: External ear normal.  Nose: Nose normal.  Mouth/Throat: Oropharynx is clear and moist. No  oropharyngeal exudate.  Eyes: Conjunctivae normal are normal. Pupils are equal, round, and reactive to light. Right eye exhibits no discharge. Left eye exhibits no discharge. No scleral icterus.  Neck: Normal range of motion. Neck supple. No tracheal deviation present. No thyromegaly present.  Cardiovascular: Normal rate, regular rhythm, normal heart sounds and intact distal pulses.  Exam reveals no gallop and no friction rub.   No murmur heard. Pulmonary/Chest: Effort normal and breath sounds normal. No respiratory distress. She has no wheezes. She has no rales. She exhibits no tenderness.  Musculoskeletal: Normal range of motion. She exhibits no edema and no tenderness.  Lymphadenopathy:    She has no cervical adenopathy.  Neurological: She is alert and oriented to person, place, and time. No cranial nerve deficit. She exhibits normal muscle tone. Coordination normal.  Skin: Skin is warm and dry. No rash noted. She is not diaphoretic. No erythema. No pallor.  Psychiatric: She has a normal mood and affect. Her behavior is normal. Judgment and thought content normal.          Assessment & Plan:  .encmed

## 2012-11-08 NOTE — Assessment & Plan Note (Signed)
Symptoms are persistent. Suspicion of heart disease is low given nuclear study was normal approximately 2 years ago and patient is extremely active, participating in aerobic exercise on almost a daily basis without consistent symptoms. Recent EKG and lab evaluation were normal. Upper endoscopy was unrevealing. Testing for H. pylori was negative. Given persistent symptoms, will set up cardiology evaluation. Question if cardiac cath might be helpful to definitively determine if she has underlying coronary artery disease contributing to symptoms. Also question of echo might be helpful both in looking at LV function and RV pressures. Appt scheduled with cardiologist this afternoon. Follow up here 4 weeks.

## 2012-11-08 NOTE — Assessment & Plan Note (Signed)
As above, extensive workup has been unrevealing. Exam is normal. Question if echo might be helpful to look at right ventricular pressures. Cardiology evaluation scheduled for today.

## 2012-11-14 DIAGNOSIS — J301 Allergic rhinitis due to pollen: Secondary | ICD-10-CM | POA: Diagnosis not present

## 2012-11-21 DIAGNOSIS — J301 Allergic rhinitis due to pollen: Secondary | ICD-10-CM | POA: Diagnosis not present

## 2012-11-28 DIAGNOSIS — J301 Allergic rhinitis due to pollen: Secondary | ICD-10-CM | POA: Diagnosis not present

## 2012-12-02 ENCOUNTER — Ambulatory Visit: Payer: Medicare Other | Admitting: Internal Medicine

## 2012-12-12 DIAGNOSIS — J301 Allergic rhinitis due to pollen: Secondary | ICD-10-CM | POA: Diagnosis not present

## 2012-12-19 DIAGNOSIS — J301 Allergic rhinitis due to pollen: Secondary | ICD-10-CM | POA: Diagnosis not present

## 2012-12-26 DIAGNOSIS — J301 Allergic rhinitis due to pollen: Secondary | ICD-10-CM | POA: Diagnosis not present

## 2013-01-02 DIAGNOSIS — J301 Allergic rhinitis due to pollen: Secondary | ICD-10-CM | POA: Diagnosis not present

## 2013-01-09 DIAGNOSIS — J301 Allergic rhinitis due to pollen: Secondary | ICD-10-CM | POA: Diagnosis not present

## 2013-01-10 DIAGNOSIS — H2510 Age-related nuclear cataract, unspecified eye: Secondary | ICD-10-CM | POA: Diagnosis not present

## 2013-01-13 DIAGNOSIS — J301 Allergic rhinitis due to pollen: Secondary | ICD-10-CM | POA: Diagnosis not present

## 2013-01-16 DIAGNOSIS — J301 Allergic rhinitis due to pollen: Secondary | ICD-10-CM | POA: Diagnosis not present

## 2013-01-17 ENCOUNTER — Telehealth: Payer: Self-pay | Admitting: General Practice

## 2013-01-17 NOTE — Telephone Encounter (Signed)
Pt called stating due to change in copay for premarin pt would like to have something else called in. Possible estrace cream of estradiol tablet. Please advise.  Pt uses [facility] on [address].

## 2013-01-17 NOTE — Telephone Encounter (Signed)
She would be changing from oral premarin to topical cream?  We will need visit to discuss.

## 2013-01-19 NOTE — Telephone Encounter (Signed)
LMTCB

## 2013-01-20 ENCOUNTER — Telehealth: Payer: Self-pay | Admitting: *Deleted

## 2013-01-20 NOTE — Telephone Encounter (Signed)
Patient calling asking for a alternative for premarin tablets, per pt the cream will be $40, the tablets are $80 and there are 3 other alternatives: estradiol, enjuvia and trinessa. Please advise

## 2013-01-20 NOTE — Telephone Encounter (Signed)
Spoke with patient and informed her she will need an appt to discuss this further since the medications serve the same purpose but are still different medications. She agreed with this and said she will call the office back next week to schedule an appt.

## 2013-01-20 NOTE — Telephone Encounter (Signed)
She needs to make an appointment to discuss

## 2013-01-23 DIAGNOSIS — J301 Allergic rhinitis due to pollen: Secondary | ICD-10-CM | POA: Diagnosis not present

## 2013-02-06 DIAGNOSIS — J301 Allergic rhinitis due to pollen: Secondary | ICD-10-CM | POA: Diagnosis not present

## 2013-02-10 ENCOUNTER — Encounter: Payer: Self-pay | Admitting: Internal Medicine

## 2013-02-10 ENCOUNTER — Ambulatory Visit (INDEPENDENT_AMBULATORY_CARE_PROVIDER_SITE_OTHER): Payer: Medicare Other | Admitting: Internal Medicine

## 2013-02-10 VITALS — BP 128/70 | HR 75 | Temp 98.0°F | Wt 120.0 lb

## 2013-02-10 DIAGNOSIS — R109 Unspecified abdominal pain: Secondary | ICD-10-CM | POA: Diagnosis not present

## 2013-02-10 DIAGNOSIS — N951 Menopausal and female climacteric states: Secondary | ICD-10-CM

## 2013-02-10 DIAGNOSIS — R0609 Other forms of dyspnea: Secondary | ICD-10-CM | POA: Diagnosis not present

## 2013-02-10 DIAGNOSIS — K219 Gastro-esophageal reflux disease without esophagitis: Secondary | ICD-10-CM | POA: Diagnosis not present

## 2013-02-10 DIAGNOSIS — R0989 Other specified symptoms and signs involving the circulatory and respiratory systems: Secondary | ICD-10-CM

## 2013-02-10 MED ORDER — ESTROGENS CONJUGATED 0.3 MG PO TABS: 0.3000 mg | ORAL_TABLET | Freq: Every day | ORAL | Status: DC

## 2013-02-10 MED ORDER — ESTROGENS, CONJUGATED 0.625 MG/GM VA CREA: TOPICAL_CREAM | Freq: Every day | VAGINAL | Status: DC

## 2013-02-10 NOTE — Assessment & Plan Note (Signed)
History of H. Pylori and gastric ulcer in the past. Symptoms of epigastric pain not improved with use of Pantoprazole. Will send breath test for H. Pylori. Will also set up repeat GI evaluation. Question if pt might benefit from EGD for re-eval ulcer.

## 2013-02-10 NOTE — Assessment & Plan Note (Signed)
Symptomatic with intermittent hot flashes despite use of Premarin po and topically. Pt is interested in bio-identical hormones. Discussed potentials risks and limitations of this approach. She will discuss with her pharmacist and consider this method.

## 2013-02-10 NOTE — Progress Notes (Signed)
Subjective:    Patient ID: Natalie [name], female    DOB: 02-14-1946, 67 y.o.   MRN: [id number]  HPI 67YO female with h/o GERD, menopausal hot flashes, presents for follow up.  Hot flashes - symptoms improved with use of oral and topical premarin, however some hot flashes remain. Cost of premarin increasing, so wants to consider other options. No h/o breast cancer.  GERD - symptoms of epigastric/chest pain persistent despite use of pantoprazole. Has h/o h. Pylori and ulcer. Last EGD 2012. No change in bowel habits, blood in stool. No weight loss. Was also recently seen by cardiologist given chest pain and some dyspnea with exertion, however decision made not to pursue repeat stress test. Pt able to exercise with aerobics 2hr per day without symptoms.  Outpatient Encounter Prescriptions as of 02/10/2013  Medication Sig Dispense Refill  . aspirin EC 81 MG tablet Take 81 mg by mouth daily.      . budesonide-formoterol (SYMBICORT) 80-4.5 MCG/ACT inhaler Inhale 2 puffs into the lungs 2 (two) times daily as needed.      Marland Kitchen estrogens, conjugated, (PREMARIN) 0.3 MG tablet Take 1 tablet (0.3 mg total) by mouth daily.  90 tablet  4  . fexofenadine (ALLEGRA) 180 MG tablet Take 180 mg by mouth daily.      . fluticasone (FLONASE) 50 MCG/ACT nasal spray Place 2 sprays into the nose daily.      Marland Kitchen KRILL OIL 1000 MG CAPS Take by mouth.      . loratadine (CLARITIN) 10 MG tablet Take 10 mg by mouth daily.      . pantoprazole (PROTONIX) 40 MG tablet TAKE 1 TABLET BY MOUTH ONCE A DAY  90 tablet  3  . [DISCONTINUED] estrogens, conjugated, (PREMARIN) 0.3 MG tablet Take 1 tablet (0.3 mg total) by mouth daily.  90 tablet  4  . conjugated estrogens (PREMARIN) vaginal cream Place vaginally daily.  42.5 g  12  . fish oil-omega-3 fatty acids 1000 MG capsule Take 2 g by mouth daily.      Marland Kitchen omeprazole (PRILOSEC OTC) 20 MG tablet Take 20 mg by mouth daily as needed.      . sulfamethoxazole-trimethoprim (BACTRIM DS)  800-160 MG per tablet Take 1 tablet by mouth 2 (two) times daily.  10 tablet  0  . tiotropium (SPIRIVA) 18 MCG inhalation capsule Place 18 mcg into inhaler and inhale daily as needed.       No facility-administered encounter medications on file as of 02/10/2013.   BP 128/70  Pulse 75  Temp(Src) 98 F (36.7 C) (Oral)  Wt 120 lb (54.432 kg)  BMI 24.03 kg/m2  SpO2 96%  Review of Systems  Constitutional: Positive for diaphoresis and fatigue. Negative for fever, chills, appetite change and unexpected weight change.  HENT: Negative for ear pain, congestion, sore throat, trouble swallowing, neck pain, voice change and sinus pressure.   Eyes: Negative for visual disturbance.  Respiratory: Positive for shortness of breath. Negative for cough, wheezing and stridor.   Cardiovascular: Positive for chest pain. Negative for palpitations and leg swelling.  Gastrointestinal: Negative for nausea, vomiting, abdominal pain, diarrhea, constipation, blood in stool, abdominal distention and anal bleeding.  Genitourinary: Negative for dysuria and flank pain.  Musculoskeletal: Negative for myalgias, arthralgias and gait problem.  Skin: Negative for color change and rash.  Neurological: Negative for dizziness and headaches.  Hematological: Negative for adenopathy. Does not bruise/bleed easily.  Psychiatric/Behavioral: Negative for suicidal ideas, sleep disturbance and dysphoric mood. The patient is  not nervous/anxious.        Objective:   Physical Exam  Constitutional: She is oriented to person, place, and time. She appears well-developed and well-nourished. No distress.  HENT:  Head: Normocephalic and atraumatic.  Right Ear: External ear normal.  Left Ear: External ear normal.  Nose: Nose normal.  Mouth/Throat: Oropharynx is clear and moist. No oropharyngeal exudate.  Eyes: Conjunctivae are normal. Pupils are equal, round, and reactive to light. Right eye exhibits no discharge. Left eye exhibits no  discharge. No scleral icterus.  Neck: Normal range of motion. Neck supple. No tracheal deviation present. No thyromegaly present.  Cardiovascular: Normal rate, regular rhythm, normal heart sounds and intact distal pulses.  Exam reveals no gallop and no friction rub.   No murmur heard. Pulmonary/Chest: Effort normal and breath sounds normal. No respiratory distress. She has no wheezes. She has no rales. She exhibits no tenderness.  Abdominal: Soft. Bowel sounds are normal. She exhibits no distension. There is no tenderness.  Musculoskeletal: Normal range of motion. She exhibits no edema and no tenderness.  Lymphadenopathy:    She has no cervical adenopathy.  Neurological: She is alert and oriented to person, place, and time. No cranial nerve deficit. She exhibits normal muscle tone. Coordination normal.  Skin: Skin is warm and dry. No rash noted. She is not diaphoretic. No erythema. No pallor.  Psychiatric: She has a normal mood and affect. Her behavior is normal. Judgment and thought content normal.          Assessment & Plan:

## 2013-02-10 NOTE — Assessment & Plan Note (Signed)
Persistent dyspnea with exertion. Evaluated by cardiologist who opted not to repeat stress test or cath. Question if GERD may be contributing, with acid reflux induced bronchospasm. Will eval as above. If workup for GI source unremarkable, then will return to cardiologist to discuss further testing including cath.

## 2013-02-13 DIAGNOSIS — J301 Allergic rhinitis due to pollen: Secondary | ICD-10-CM | POA: Diagnosis not present

## 2013-02-15 ENCOUNTER — Telehealth: Payer: Self-pay | Admitting: Internal Medicine

## 2013-02-15 NOTE — Telephone Encounter (Signed)
H. Pylori neg.

## 2013-02-20 DIAGNOSIS — J330 Polyp of nasal cavity: Secondary | ICD-10-CM | POA: Diagnosis not present

## 2013-02-20 DIAGNOSIS — J301 Allergic rhinitis due to pollen: Secondary | ICD-10-CM | POA: Diagnosis not present

## 2013-02-21 ENCOUNTER — Telehealth: Payer: Self-pay | Admitting: Internal Medicine

## 2013-02-21 NOTE — Telephone Encounter (Signed)
LMTCB

## 2013-02-21 NOTE — Telephone Encounter (Signed)
Patient wanting results from her H-pylori

## 2013-02-21 NOTE — Telephone Encounter (Signed)
Patient returned your call regarding her results °

## 2013-02-22 DIAGNOSIS — J330 Polyp of nasal cavity: Secondary | ICD-10-CM | POA: Diagnosis not present

## 2013-02-22 DIAGNOSIS — J301 Allergic rhinitis due to pollen: Secondary | ICD-10-CM | POA: Diagnosis not present

## 2013-02-22 NOTE — Telephone Encounter (Signed)
Spoke with patient, informed her test was negative and a message was sent to her on 02/15/2013 via MyChart. Per patient she did not receive it and has been having trouble with her MyChart account, however she has an appointment on this Friday with GI

## 2013-02-24 DIAGNOSIS — R1310 Dysphagia, unspecified: Secondary | ICD-10-CM | POA: Diagnosis not present

## 2013-02-24 DIAGNOSIS — R110 Nausea: Secondary | ICD-10-CM | POA: Diagnosis not present

## 2013-02-24 DIAGNOSIS — K219 Gastro-esophageal reflux disease without esophagitis: Secondary | ICD-10-CM | POA: Diagnosis not present

## 2013-03-01 DIAGNOSIS — J301 Allergic rhinitis due to pollen: Secondary | ICD-10-CM | POA: Diagnosis not present

## 2013-03-03 ENCOUNTER — Encounter: Payer: Self-pay | Admitting: Internal Medicine

## 2013-03-06 DIAGNOSIS — J301 Allergic rhinitis due to pollen: Secondary | ICD-10-CM | POA: Diagnosis not present

## 2013-03-13 ENCOUNTER — Ambulatory Visit: Payer: Self-pay | Admitting: Unknown Physician Specialty

## 2013-03-13 DIAGNOSIS — R1310 Dysphagia, unspecified: Secondary | ICD-10-CM | POA: Diagnosis not present

## 2013-03-13 DIAGNOSIS — K319 Disease of stomach and duodenum, unspecified: Secondary | ICD-10-CM | POA: Diagnosis not present

## 2013-03-13 DIAGNOSIS — R110 Nausea: Secondary | ICD-10-CM | POA: Diagnosis not present

## 2013-03-13 DIAGNOSIS — K224 Dyskinesia of esophagus: Secondary | ICD-10-CM | POA: Diagnosis not present

## 2013-03-13 DIAGNOSIS — Z8371 Family history of colonic polyps: Secondary | ICD-10-CM | POA: Diagnosis not present

## 2013-03-13 DIAGNOSIS — Z79899 Other long term (current) drug therapy: Secondary | ICD-10-CM | POA: Diagnosis not present

## 2013-03-13 DIAGNOSIS — K2940 Chronic atrophic gastritis without bleeding: Secondary | ICD-10-CM | POA: Diagnosis not present

## 2013-03-13 DIAGNOSIS — K219 Gastro-esophageal reflux disease without esophagitis: Secondary | ICD-10-CM | POA: Diagnosis not present

## 2013-03-13 DIAGNOSIS — K2990 Gastroduodenitis, unspecified, without bleeding: Secondary | ICD-10-CM | POA: Diagnosis not present

## 2013-03-13 DIAGNOSIS — Z885 Allergy status to narcotic agent status: Secondary | ICD-10-CM | POA: Diagnosis not present

## 2013-03-13 DIAGNOSIS — Z7982 Long term (current) use of aspirin: Secondary | ICD-10-CM | POA: Diagnosis not present

## 2013-03-13 DIAGNOSIS — R109 Unspecified abdominal pain: Secondary | ICD-10-CM | POA: Diagnosis not present

## 2013-03-13 DIAGNOSIS — Z86010 Personal history of colonic polyps: Secondary | ICD-10-CM | POA: Diagnosis not present

## 2013-03-13 DIAGNOSIS — J309 Allergic rhinitis, unspecified: Secondary | ICD-10-CM | POA: Diagnosis not present

## 2013-03-13 DIAGNOSIS — K449 Diaphragmatic hernia without obstruction or gangrene: Secondary | ICD-10-CM | POA: Diagnosis not present

## 2013-03-13 DIAGNOSIS — Z883 Allergy status to other anti-infective agents status: Secondary | ICD-10-CM | POA: Diagnosis not present

## 2013-03-13 DIAGNOSIS — J329 Chronic sinusitis, unspecified: Secondary | ICD-10-CM | POA: Diagnosis not present

## 2013-03-15 LAB — PATHOLOGY REPORT

## 2013-03-22 DIAGNOSIS — J301 Allergic rhinitis due to pollen: Secondary | ICD-10-CM | POA: Diagnosis not present

## 2013-03-27 DIAGNOSIS — J301 Allergic rhinitis due to pollen: Secondary | ICD-10-CM | POA: Diagnosis not present

## 2013-03-28 DIAGNOSIS — J301 Allergic rhinitis due to pollen: Secondary | ICD-10-CM | POA: Diagnosis not present

## 2013-03-30 ENCOUNTER — Encounter: Payer: Self-pay | Admitting: Internal Medicine

## 2013-03-30 DIAGNOSIS — R1013 Epigastric pain: Secondary | ICD-10-CM | POA: Diagnosis not present

## 2013-03-30 DIAGNOSIS — R1310 Dysphagia, unspecified: Secondary | ICD-10-CM | POA: Diagnosis not present

## 2013-03-30 DIAGNOSIS — K449 Diaphragmatic hernia without obstruction or gangrene: Secondary | ICD-10-CM | POA: Diagnosis not present

## 2013-03-30 DIAGNOSIS — K2990 Gastroduodenitis, unspecified, without bleeding: Secondary | ICD-10-CM | POA: Diagnosis not present

## 2013-03-30 DIAGNOSIS — K3189 Other diseases of stomach and duodenum: Secondary | ICD-10-CM | POA: Diagnosis not present

## 2013-03-30 DIAGNOSIS — K2970 Gastritis, unspecified, without bleeding: Secondary | ICD-10-CM | POA: Diagnosis not present

## 2013-04-10 DIAGNOSIS — J301 Allergic rhinitis due to pollen: Secondary | ICD-10-CM | POA: Diagnosis not present

## 2013-04-14 DIAGNOSIS — R12 Heartburn: Secondary | ICD-10-CM | POA: Diagnosis not present

## 2013-04-14 DIAGNOSIS — R948 Abnormal results of function studies of other organs and systems: Secondary | ICD-10-CM | POA: Diagnosis not present

## 2013-04-14 DIAGNOSIS — R1314 Dysphagia, pharyngoesophageal phase: Secondary | ICD-10-CM | POA: Diagnosis not present

## 2013-04-17 DIAGNOSIS — J301 Allergic rhinitis due to pollen: Secondary | ICD-10-CM | POA: Diagnosis not present

## 2013-04-24 DIAGNOSIS — J301 Allergic rhinitis due to pollen: Secondary | ICD-10-CM | POA: Diagnosis not present

## 2013-05-03 DIAGNOSIS — J301 Allergic rhinitis due to pollen: Secondary | ICD-10-CM | POA: Diagnosis not present

## 2013-05-04 DIAGNOSIS — R1310 Dysphagia, unspecified: Secondary | ICD-10-CM | POA: Diagnosis not present

## 2013-05-04 DIAGNOSIS — R079 Chest pain, unspecified: Secondary | ICD-10-CM | POA: Diagnosis not present

## 2013-05-15 DIAGNOSIS — J301 Allergic rhinitis due to pollen: Secondary | ICD-10-CM | POA: Diagnosis not present

## 2013-05-18 ENCOUNTER — Encounter: Payer: Self-pay | Admitting: Unknown Physician Specialty

## 2013-05-22 DIAGNOSIS — J301 Allergic rhinitis due to pollen: Secondary | ICD-10-CM | POA: Diagnosis not present

## 2013-05-31 DIAGNOSIS — J301 Allergic rhinitis due to pollen: Secondary | ICD-10-CM | POA: Diagnosis not present

## 2013-06-12 DIAGNOSIS — J301 Allergic rhinitis due to pollen: Secondary | ICD-10-CM | POA: Diagnosis not present

## 2013-06-19 DIAGNOSIS — J301 Allergic rhinitis due to pollen: Secondary | ICD-10-CM | POA: Diagnosis not present

## 2013-06-21 ENCOUNTER — Other Ambulatory Visit: Payer: Self-pay | Admitting: Internal Medicine

## 2013-06-23 DIAGNOSIS — J301 Allergic rhinitis due to pollen: Secondary | ICD-10-CM | POA: Diagnosis not present

## 2013-06-26 DIAGNOSIS — J301 Allergic rhinitis due to pollen: Secondary | ICD-10-CM | POA: Diagnosis not present

## 2013-07-04 DIAGNOSIS — H43819 Vitreous degeneration, unspecified eye: Secondary | ICD-10-CM | POA: Diagnosis not present

## 2013-07-04 DIAGNOSIS — H2510 Age-related nuclear cataract, unspecified eye: Secondary | ICD-10-CM | POA: Diagnosis not present

## 2013-07-10 DIAGNOSIS — J301 Allergic rhinitis due to pollen: Secondary | ICD-10-CM | POA: Diagnosis not present

## 2013-07-17 DIAGNOSIS — J301 Allergic rhinitis due to pollen: Secondary | ICD-10-CM | POA: Diagnosis not present

## 2013-07-19 ENCOUNTER — Other Ambulatory Visit: Payer: Self-pay

## 2013-07-26 DIAGNOSIS — J301 Allergic rhinitis due to pollen: Secondary | ICD-10-CM | POA: Diagnosis not present

## 2013-08-02 DIAGNOSIS — J301 Allergic rhinitis due to pollen: Secondary | ICD-10-CM | POA: Diagnosis not present

## 2013-08-09 DIAGNOSIS — J301 Allergic rhinitis due to pollen: Secondary | ICD-10-CM | POA: Diagnosis not present

## 2013-08-15 ENCOUNTER — Ambulatory Visit (INDEPENDENT_AMBULATORY_CARE_PROVIDER_SITE_OTHER): Payer: Medicare Other | Admitting: Cardiovascular Disease

## 2013-08-15 ENCOUNTER — Encounter: Payer: Self-pay | Admitting: Cardiovascular Disease

## 2013-08-15 VITALS — BP 136/74 | HR 70 | Resp 16 | Ht 60.0 in | Wt 112.0 lb

## 2013-08-15 DIAGNOSIS — R0789 Other chest pain: Secondary | ICD-10-CM | POA: Diagnosis not present

## 2013-08-15 DIAGNOSIS — R079 Chest pain, unspecified: Secondary | ICD-10-CM

## 2013-08-15 MED ORDER — NITROGLYCERIN 0.3 MG SL SUBL: 0.3000 mg | SUBLINGUAL_TABLET | SUBLINGUAL | Status: DC | PRN

## 2013-08-15 NOTE — Patient Instructions (Addendum)
Your physician recommends that you schedule a follow-up appointment in: 12 months Your physician has recommended you make the following change in your medication: Take nitroglycerin 0.3 mg sublingual tablets as needed for chest discomfort. He can take 1 tablet every 5 minutes to a maximum of 3 tablets. Please sit down or lie down before taking this medication. Nitroglycerin can help with esophageal causes of chest pain as well as with coronary causes of chest pain.  Your physician discussed the importance of regular exercise and recommended that you start or continue a regular exercise program for good health.

## 2013-08-15 NOTE — Progress Notes (Signed)
Patient ID: Natalie [name], female   DOB: 08/09/1946, 67 y.o.   MRN: [id number]     Reason for office visit Followup chest discomfort  Natalie [name] is now 67 years old and there is of her concerns related to early onset coronary artery disease in multiple family members, including progression to bypass surgery and immediate family members with sudden cardiac death.   She has occasional chest discomfort u occurs at rest and never occurs during intense physical activity. 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 She is now bothered by chest discomfort when this intense activity is in progress but often feels some chest discomfort when she's at rest sitting in a chair. She has not noticed a clear pattern of association with meals. There is definitely a component of anxiety involved. She denies dyspnea, edema or focal neurological deficits.    Allergies  Allergen Reactions  . Aspirin Shortness Of Breath    EC Maximum Strength  . Clarithromycin Nausea And Vomiting  . Codeine Nausea And Vomiting  . Soma [Carisoprodol] Nausea And Vomiting  . Tramadol Nausea And Vomiting    Current Outpatient Prescriptions  Medication Sig Dispense Refill  . aspirin EC 81 MG tablet Take 81 mg by mouth daily.      . baclofen (LIORESAL) 10 MG tablet Take 10 mg by mouth 2 (two) times daily.      . budesonide-formoterol (SYMBICORT) 80-4.5 MCG/ACT inhaler Inhale 2 puffs into the lungs 2 (two) times daily as needed.      . Cholecalciferol (VITAMIN D-3) 1000 UNITS CAPS Take 1,000 each by mouth daily.      Marland Kitchen conjugated estrogens (PREMARIN) vaginal cream Place vaginally daily.  42.5 g  12  . estrogens, conjugated, (PREMARIN) 0.3 MG tablet Take 1 tablet (0.3 mg total) by mouth daily.  90 tablet  4  . fluticasone (FLONASE) 50 MCG/ACT nasal spray Place 2 sprays into the nose daily.      Marland Kitchen KRILL OIL 1000 MG CAPS Take by mouth.      . loratadine (CLARITIN) 10 MG tablet Take 10  mg by mouth daily.      Marland Kitchen tiotropium (SPIRIVA) 18 MCG inhalation capsule Place 18 mcg into inhaler and inhale daily as needed.      . nitroGLYCERIN (NITROSTAT) 0.3 MG SL tablet Place 1 tablet (0.3 mg total) under the tongue every 5 (five) minutes as needed for chest pain.  90 tablet  3   No current facility-administered medications for this visit.    Past Medical History  Diagnosis Date  . Allergy   . Nasal polyps     followed by Dr Jenne Campus  . Chest pain     Past Surgical History  Procedure Laterality Date  . Vaginal delivery      x 3  . Abdominal hysterectomy  1984    due to fibroids  . Appendectomy  1952  . Tonsillectomy  1950  . Nasal sinus surgery  1988, 2008  . Hemorroidectomy  1995    Family History  Problem Relation Age of Onset  . Heart disease Mother   . COPD Father   . Heart disease Brother   . COPD Brother   . Cancer Daughter     Ovary  . Heart disease Maternal Grandmother   . Heart disease Maternal Grandfather   . Cancer Paternal Grandmother     Ovary - 30's and again in 89's  . COPD Paternal Grandfather  History   Social History  . Marital Status: Married    Spouse Name: N/A    Number of Children: 3  . Years of Education: N/A   Occupational History  . Not on file.   Social History Main Topics  . Smoking status: Never Smoker   . Smokeless tobacco: Never Used  . Alcohol Use: No  . Drug Use: No  . Sexual Activity: Not on file   Other Topics Concern  . Not on file   Social History Narrative  . No narrative on file    Review of systems: The patient specifically denies any dyspnea at rest or with exertion, orthopnea, paroxysmal nocturnal dyspnea, syncope, palpitations, focal neurological deficits, intermittent claudication, lower extremity edema, unexplained weight gain, cough, hemoptysis or wheezing.  The patient also denies abdominal pain, nausea, vomiting, dysphagia, diarrhea, constipation, polyuria, polydipsia, dysuria, hematuria,  frequency, urgency, abnormal bleeding or bruising, fever, chills, unexpected weight changes, mood swings, change in skin or hair texture, change in voice quality, auditory or visual problems, allergic reactions or rashes, new musculoskeletal complaints other than usual "aches and pains".   PHYSICAL EXAM BP 136/74  Pulse 70  Resp 16  Ht 5' (1.524 m)  Wt 112 lb (50.803 kg)  BMI 21.87 kg/m2  General: Alert, oriented x3, no distress, looks physically fit and younger than her stated Head: no evidence of trauma, PERRL, EOMI, no exophtalmos or lid lag, no myxedema, no xanthelasma; normal ears, nose and oropharynx Neck: normal jugular venous pulsations and no hepatojugular reflux; brisk carotid pulses without delay and no carotid bruits Chest: clear to auscultation, no signs of consolidation by percussion or palpation, normal fremitus, symmetrical and full respiratory excursions Cardiovascular: normal position and quality of the apical impulse, regular rhythm, normal first and second heart sounds, no murmurs, rubs or gallops Abdomen: no tenderness or distention, no masses by palpation, no abnormal pulsatility or arterial bruits, normal bowel sounds, no hepatosplenomegaly Extremities: no clubbing, cyanosis or edema; 2+ radial, ulnar and brachial pulses bilaterally; 2+ right femoral, posterior tibial and dorsalis pedis pulses; 2+ left femoral, posterior tibial and dorsalis pedis pulses; no subclavian or femoral bruits Neurological: grossly nonfocal   EKG: Sinus rhythm, normal tracing  Lipid Panel     Component Value Date/Time   CHOL 184 02/22/2012 1554   TRIG 84.0 02/22/2012 1554   HDL 79.90 02/22/2012 1554   CHOLHDL 2 02/22/2012 1554   VLDL 16.8 02/22/2012 1554   LDLCALC 87 02/22/2012 1554    BMET    Component Value Date/Time   NA 139 09/30/2012 1358   NA 140 12/04/2010   K 4.2 09/30/2012 1358   CL 102 09/30/2012 1358   CO2 31 09/30/2012 1358   GLUCOSE 108* 09/30/2012 1358   BUN 10  09/30/2012 1358   BUN 9 12/04/2010   CREATININE 0.8 09/30/2012 1358   CREATININE 0.6 12/04/2010   CALCIUM 9.3 09/30/2012 1358     ASSESSMENT AND PLAN Chest discomfort Her description of chest pain to me does not offer any of the typical hallmark is of coronary insufficiency. She has daily chest tightness has never associated changes on her ECG. It is not associated with activity or exercise. She has been diagnosed with an esophageal dysmotility disorder. I don't know the details of this as it was done [facility]. I wonder whether she could have esophageal spasm. Another possibility is that she has dysphagia related to achalasia. I think she can safely take nitroglycerin sublingually whether her pain is  from esophageal spasm or coronary spasm. The latter is less likely since she does not have any of the typical risk factors for coronary vasospasm. In fact her only major coronary risk factor is her family history. I don't see a good reason for her to restrict physical activity and in fact encourage her to continue with her program of physical fitness. On the other hand, both for her reassurance and because of the family history of coronary disease, it is not unreasonable for her to undergo a simple exercise tolerance test on a periodic basis. She is scheduled for a treadmill Bruce protocol stress test.  Orders Placed This Encounter  Procedures  . EKG 12-Lead  . Exercise tolerance test   Meds ordered this encounter  Medications  . Cholecalciferol (VITAMIN D-3) 1000 UNITS CAPS    Sig: Take 1,000 each by mouth daily.  . baclofen (LIORESAL) 10 MG tablet    Sig: Take 10 mg by mouth 2 (two) times daily.  . nitroGLYCERIN (NITROSTAT) 0.3 MG SL tablet    Sig: Place 1 tablet (0.3 mg total) under the tongue every 5 (five) minutes as needed for chest pain.    Dispense:  90 tablet    Refill:  3    Cherrill Scrima  Thurmon Fair, MD, [facility] [phone] office [phone] pager

## 2013-08-16 DIAGNOSIS — J301 Allergic rhinitis due to pollen: Secondary | ICD-10-CM | POA: Diagnosis not present

## 2013-08-21 DIAGNOSIS — J301 Allergic rhinitis due to pollen: Secondary | ICD-10-CM | POA: Diagnosis not present

## 2013-08-22 ENCOUNTER — Ambulatory Visit (HOSPITAL_COMMUNITY)
Admission: RE | Admit: 2013-08-22 | Discharge: 2013-08-22 | Disposition: A | Discharge status: Home / Self Care | Payer: Medicare Other | Source: Ambulatory Visit | Attending: Cardiovascular Disease | Admitting: Cardiovascular Disease

## 2013-08-22 DIAGNOSIS — R079 Chest pain, unspecified: Secondary | ICD-10-CM

## 2013-08-24 ENCOUNTER — Telehealth: Payer: Self-pay | Admitting: *Deleted

## 2013-08-24 DIAGNOSIS — J301 Allergic rhinitis due to pollen: Secondary | ICD-10-CM | POA: Diagnosis not present

## 2013-08-24 NOTE — Telephone Encounter (Signed)
Normal stress test results called to patient. 

## 2013-08-24 NOTE — Telephone Encounter (Signed)
Message copied by Vita Barley on Thu Aug 24, 2013  8:48 AM ------      Message from: Thurmon Fair      Created: Wed Aug 23, 2013  8:35 AM       Her stress test was normal, very good exercise capacity ------

## 2013-08-27 DIAGNOSIS — R0789 Other chest pain: Secondary | ICD-10-CM | POA: Insufficient documentation

## 2013-08-27 NOTE — Assessment & Plan Note (Addendum)
Her description of chest pain to me does not offer any of the typical hallmark is of coronary insufficiency. She has daily chest tightness has never associated changes on her ECG. It is not associated with activity or exercise. She has been diagnosed with an esophageal dysmotility disorder. I don't know the details of this as it was done [facility]. I wonder whether she could have esophageal spasm. Another possibility is that she has dysphagia related to achalasia. I think she can safely take nitroglycerin sublingually whether her pain is from esophageal spasm or coronary spasm. The latter is less likely since she does not have any of the typical risk factors for coronary vasospasm. In fact her only major coronary risk factor is her family history. I don't see a good reason for her to restrict physical activity and in fact encourage her to continue with her program of physical fitness. On the other hand, both for her reassurance and because of the family history of coronary disease, it is not unreasonable for her to undergo a simple exercise tolerance test on a periodic basis. She is scheduled for a treadmill Bruce protocol stress test.

## 2013-09-06 DIAGNOSIS — J301 Allergic rhinitis due to pollen: Secondary | ICD-10-CM | POA: Diagnosis not present

## 2013-09-13 DIAGNOSIS — J301 Allergic rhinitis due to pollen: Secondary | ICD-10-CM | POA: Diagnosis not present

## 2013-09-15 DIAGNOSIS — J301 Allergic rhinitis due to pollen: Secondary | ICD-10-CM | POA: Diagnosis not present

## 2013-09-19 DIAGNOSIS — R1310 Dysphagia, unspecified: Secondary | ICD-10-CM | POA: Diagnosis not present

## 2013-09-19 DIAGNOSIS — R079 Chest pain, unspecified: Secondary | ICD-10-CM | POA: Diagnosis not present

## 2013-09-19 DIAGNOSIS — R933 Abnormal findings on diagnostic imaging of other parts of digestive tract: Secondary | ICD-10-CM | POA: Diagnosis not present

## 2013-09-19 DIAGNOSIS — Z8371 Family history of colonic polyps: Secondary | ICD-10-CM | POA: Diagnosis not present

## 2013-09-20 DIAGNOSIS — J301 Allergic rhinitis due to pollen: Secondary | ICD-10-CM | POA: Diagnosis not present

## 2013-09-27 DIAGNOSIS — J301 Allergic rhinitis due to pollen: Secondary | ICD-10-CM | POA: Diagnosis not present

## 2013-10-04 DIAGNOSIS — J301 Allergic rhinitis due to pollen: Secondary | ICD-10-CM | POA: Diagnosis not present

## 2013-10-11 DIAGNOSIS — J301 Allergic rhinitis due to pollen: Secondary | ICD-10-CM | POA: Diagnosis not present

## 2013-10-18 DIAGNOSIS — J301 Allergic rhinitis due to pollen: Secondary | ICD-10-CM | POA: Diagnosis not present

## 2013-10-19 ENCOUNTER — Other Ambulatory Visit: Payer: Self-pay

## 2013-10-25 DIAGNOSIS — J301 Allergic rhinitis due to pollen: Secondary | ICD-10-CM | POA: Diagnosis not present

## 2013-11-01 DIAGNOSIS — J301 Allergic rhinitis due to pollen: Secondary | ICD-10-CM | POA: Diagnosis not present

## 2013-11-07 ENCOUNTER — Ambulatory Visit (INDEPENDENT_AMBULATORY_CARE_PROVIDER_SITE_OTHER): Payer: Medicare Other | Admitting: Internal Medicine

## 2013-11-07 ENCOUNTER — Encounter: Payer: Self-pay | Admitting: Internal Medicine

## 2013-11-07 ENCOUNTER — Encounter: Payer: Self-pay | Admitting: Emergency Medicine

## 2013-11-07 VITALS — BP 118/70 | HR 67 | Temp 97.9°F | Ht 59.75 in | Wt 108.0 lb

## 2013-11-07 DIAGNOSIS — R7309 Other abnormal glucose: Secondary | ICD-10-CM

## 2013-11-07 DIAGNOSIS — Z23 Encounter for immunization: Secondary | ICD-10-CM

## 2013-11-07 DIAGNOSIS — Z136 Encounter for screening for cardiovascular disorders: Secondary | ICD-10-CM

## 2013-11-07 DIAGNOSIS — Z780 Asymptomatic menopausal state: Secondary | ICD-10-CM | POA: Diagnosis not present

## 2013-11-07 DIAGNOSIS — Z0000 Encounter for general adult medical examination without abnormal findings: Secondary | ICD-10-CM | POA: Diagnosis not present

## 2013-11-07 DIAGNOSIS — R739 Hyperglycemia, unspecified: Secondary | ICD-10-CM | POA: Insufficient documentation

## 2013-11-07 LAB — CBC WITH DIFFERENTIAL/PLATELET
Basophils Relative: 0.7 % (ref 0.0–3.0)
Eosinophils Relative: 4.1 % (ref 0.0–5.0)
Lymphocytes Relative: 27.8 % (ref 12.0–46.0)
Neutrophils Relative %: 58.4 % (ref 43.0–77.0)
RBC: 4.18 Mil/uL (ref 3.87–5.11)
WBC: 6.1 10*3/uL (ref 4.5–10.5)

## 2013-11-07 LAB — COMPREHENSIVE METABOLIC PANEL
ALT: 17 U/L (ref 0–35)
Alkaline Phosphatase: 53 U/L (ref 39–117)
CO2: 30 mEq/L (ref 19–32)
GFR: 99.74 mL/min (ref >60.00)
Potassium: 4.5 mEq/L (ref 3.5–5.1)
Sodium: 139 mEq/L (ref 135–145)
Total Bilirubin: 0.4 mg/dL (ref 0.3–1.2)
Total Protein: 7.2 g/dL (ref 6.0–8.3)

## 2013-11-07 LAB — MICROALBUMIN / CREATININE URINE RATIO
Creatinine,U: 61.4 mg/dL
Microalb Creat Ratio: 0.2 mg/g (ref 0.0–30.0)

## 2013-11-07 LAB — LIPID PANEL
LDL Cholesterol: 115 mg/dL — ABNORMAL HIGH (ref 0–99)
VLDL: 11 mg/dL (ref 0.0–40.0)

## 2013-11-07 LAB — HEMOGLOBIN A1C: Hgb A1c MFr Bld: 6 % (ref 4.6–6.5)

## 2013-11-07 MED ORDER — ESTRADIOL 0.5 MG PO TABS: 0.5000 mg | ORAL_TABLET | Freq: Every day | ORAL | Status: DC

## 2013-11-07 NOTE — Progress Notes (Signed)
Subjective:    Patient ID: Natalie [name], female    DOB: [birth year], 67 y.o.   MRN: [id number]  HPI The patient is here for annual Medicare wellness examination and management of other chronic and acute problems.   The risk factors are reflected in the social history.  The roster of all physicians providing medical care to patient - is listed in the Snapshot section of the chart.  Activities of daily living:  The patient is 100% independent in all ADLs: dressing, toileting, feeding as well as independent mobility. Lives with husband. No pets.  Home safety : The patient has smoke detectors in the home. They wear seatbelts.  There are no firearms at home. There is no violence in the home.   There is no risks for hepatitis, STDs or HIV. There is no history of blood transfusion. They have no travel history to infectious disease endemic areas of the world.  The patient has seen their dentist in the last six month. Dentist- Dr. Alvy Bimler in [place] They have seen their eye doctor in the last year. Watching a cataract. Also has floaters. Opthalmologist - Dr. Clydene Pugh in [place]  They have deferred audiologic testing in the last year. No issues with hearing.  They do not  have excessive sun exposure. Discussed the need for sun protection: hats, long sleeves and use of sunscreen if there is significant sun exposure. Dermatologist - Dr. Jarold Motto in the past  Diet: the importance of a healthy diet is discussed. They do have a healthy diet.  The benefits of regular aerobic exercise were discussed. She continues to exercise daily, takes zumba and cardio classes as well as line dancing.  Depression screen: there are no signs or vegative symptoms of depression- irritability, change in appetite, anhedonia, sadness/tearfullness.  Cognitive assessment: the patient manages all their financial and personal affairs and is actively engaged. They could relate day,date,year and events.  HCPOA - none in  place  The following portions of the patient's history were reviewed and updated as appropriate: allergies, current medications, past family history, past medical history,  past surgical history, past social history  and problem list.  Visual acuity was not assessed per patient preference since she has regular follow up with her ophthalmologist. Hearing and body mass index were assessed and reviewed.   During the course of the visit the patient was educated and counseled about appropriate screening and preventive services including : fall prevention , diabetes screening, nutrition counseling, colorectal cancer screening, and recommended immunizations.    She is concerned today about the cost of her estrogen preparation. She currently uses both topical Premarin and oral Premarin 0.3 mg taken every other day. She has tried to taper off of this with increased hot flashes. She would like to change to estradiol because of insurance coverage.   Outpatient Encounter Prescriptions as of 11/07/2013  Medication Sig  . aspirin EC 81 MG tablet Take 81 mg by mouth daily.  . baclofen (LIORESAL) 10 MG tablet Take 10 mg by mouth 2 (two) times daily.  . budesonide-formoterol (SYMBICORT) 80-4.5 MCG/ACT inhaler Inhale 2 puffs into the lungs 2 (two) times daily as needed.  . Cholecalciferol (VITAMIN D-3) 1000 UNITS CAPS Take 1,000 each by mouth daily.  Marland Kitchen conjugated estrogens (PREMARIN) vaginal cream Place vaginally daily.  . fluticasone (FLONASE) 50 MCG/ACT nasal spray Place 2 sprays into the nose daily.  Marland Kitchen KRILL OIL 1000 MG CAPS Take by mouth.  . loratadine (CLARITIN) 10 MG tablet Take 10 mg  by mouth daily.  . nitroGLYCERIN (NITROSTAT) 0.3 MG SL tablet Place 1 tablet (0.3 mg total) under the tongue every 5 (five) minutes as needed for chest pain.  . [DISCONTINUED] estrogens, conjugated, (PREMARIN) 0.3 MG tablet Take 1 tablet (0.3 mg total) by mouth daily.  Marland Kitchen estradiol (ESTRACE) 0.5 MG tablet Take 1 tablet (0.5  mg total) by mouth daily.   BP 118/70  Pulse 67  Temp(Src) 97.9 F (36.6 C) (Oral)  Ht 4' 11.75" (1.518 m)  Wt 108 lb (48.988 kg)  BMI 21.26 kg/m2  SpO2 97%  Review of Systems  Constitutional: Negative for fever, chills, appetite change, fatigue and unexpected weight change.  HENT: Negative for congestion, ear pain, sinus pressure, sore throat, trouble swallowing and voice change.   Eyes: Negative for visual disturbance.  Respiratory: Negative for cough, shortness of breath, wheezing and stridor.   Cardiovascular: Negative for chest pain, palpitations and leg swelling.  Gastrointestinal: Negative for nausea, vomiting, abdominal pain, diarrhea, constipation, blood in stool, abdominal distention and anal bleeding.  Genitourinary: Negative for dysuria and flank pain.  Musculoskeletal: Negative for arthralgias, gait problem, myalgias and neck pain.  Skin: Negative for color change and rash.  Neurological: Negative for dizziness and headaches.  Hematological: Negative for adenopathy. Does not bruise/bleed easily.  Psychiatric/Behavioral: Negative for suicidal ideas, sleep disturbance and dysphoric mood. The patient is not nervous/anxious.        Objective:   Physical Exam  Constitutional: She is oriented to person, place, and time. She appears well-developed and well-nourished. No distress.  HENT:  Head: Normocephalic and atraumatic.  Right Ear: External ear normal.  Left Ear: External ear normal.  Nose: Nose normal.  Mouth/Throat: Oropharynx is clear and moist. No oropharyngeal exudate.  Eyes: Conjunctivae are normal. Pupils are equal, round, and reactive to light. Right eye exhibits no discharge. Left eye exhibits no discharge. No scleral icterus.  Neck: Normal range of motion. Neck supple. No tracheal deviation present. No thyromegaly present.  Cardiovascular: Normal rate, regular rhythm, normal heart sounds and intact distal pulses.  Exam reveals no gallop and no friction rub.    No murmur heard. Pulmonary/Chest: Effort normal and breath sounds normal. No accessory muscle usage. Not tachypneic. No respiratory distress. She has no decreased breath sounds. She has no wheezes. She has no rhonchi. She has no rales. She exhibits no tenderness. Right breast exhibits no inverted nipple, no mass, no nipple discharge, no skin change and no tenderness. Left breast exhibits no inverted nipple, no mass, no nipple discharge, no skin change and no tenderness. Breasts are symmetrical.  Abdominal: Soft. Bowel sounds are normal. She exhibits no distension and no mass. There is no tenderness. There is no rebound and no guarding.  Musculoskeletal: Normal range of motion. She exhibits no edema and no tenderness.  Lymphadenopathy:    She has no cervical adenopathy.  Neurological: She is alert and oriented to person, place, and time. No cranial nerve deficit. She exhibits normal muscle tone. Coordination normal.  Skin: Skin is warm and dry. No rash noted. She is not diaphoretic. No erythema. No pallor.  Psychiatric: She has a normal mood and affect. Her behavior is normal. Judgment and thought content normal.          Assessment & Plan:

## 2013-11-07 NOTE — Assessment & Plan Note (Signed)
General medical exam including breast exam normal today. Pap and pelvic deferred as patient is status post hysterectomy and Pap in 2012 was normal. She also had pelvic ultrasound in 2013 which was normal. Mammogram and bone density testing ordered. Will check labs including CBC, CMP, lipid profile, A1c. Encouraged continued efforts at healthy diet and regular physical activity. Influenza vaccine given today.

## 2013-11-07 NOTE — Progress Notes (Signed)
Pre-visit discussion using our clinic review tool. No additional management support is needed unless otherwise documented below in the visit note.  

## 2013-11-07 NOTE — Assessment & Plan Note (Signed)
Today we discussed estrogen replacement and potential risk of this including increased risk for heart disease, blood clots, and breast cancer. Patient understands the risks and would like to continue therapy with supplemental question because of hot flashes which interfere with quality of life. Will try changing to estradiol 0.5 mg daily. Encouraged her to try to taper this medication to every other day, with plan to taper off in the future 3-6 months.

## 2013-11-07 NOTE — Assessment & Plan Note (Signed)
Patient reports elevated blood sugars on screening labs at her work. Will check A1c with labs today.

## 2013-11-08 DIAGNOSIS — J301 Allergic rhinitis due to pollen: Secondary | ICD-10-CM | POA: Diagnosis not present

## 2013-11-15 DIAGNOSIS — J301 Allergic rhinitis due to pollen: Secondary | ICD-10-CM | POA: Diagnosis not present

## 2013-11-29 DIAGNOSIS — J301 Allergic rhinitis due to pollen: Secondary | ICD-10-CM | POA: Diagnosis not present

## 2013-12-05 DIAGNOSIS — J301 Allergic rhinitis due to pollen: Secondary | ICD-10-CM | POA: Diagnosis not present

## 2013-12-19 DIAGNOSIS — J301 Allergic rhinitis due to pollen: Secondary | ICD-10-CM | POA: Diagnosis not present

## 2013-12-26 DIAGNOSIS — J301 Allergic rhinitis due to pollen: Secondary | ICD-10-CM | POA: Diagnosis not present

## 2014-01-03 DIAGNOSIS — J301 Allergic rhinitis due to pollen: Secondary | ICD-10-CM | POA: Diagnosis not present

## 2014-01-10 DIAGNOSIS — J301 Allergic rhinitis due to pollen: Secondary | ICD-10-CM | POA: Diagnosis not present

## 2014-01-18 DIAGNOSIS — J301 Allergic rhinitis due to pollen: Secondary | ICD-10-CM | POA: Diagnosis not present

## 2014-01-31 DIAGNOSIS — J301 Allergic rhinitis due to pollen: Secondary | ICD-10-CM | POA: Diagnosis not present

## 2014-02-01 DIAGNOSIS — H2510 Age-related nuclear cataract, unspecified eye: Secondary | ICD-10-CM | POA: Diagnosis not present

## 2014-02-07 DIAGNOSIS — J301 Allergic rhinitis due to pollen: Secondary | ICD-10-CM | POA: Diagnosis not present

## 2014-02-15 DIAGNOSIS — J301 Allergic rhinitis due to pollen: Secondary | ICD-10-CM | POA: Diagnosis not present

## 2014-02-22 DIAGNOSIS — J301 Allergic rhinitis due to pollen: Secondary | ICD-10-CM | POA: Diagnosis not present

## 2014-02-28 DIAGNOSIS — J301 Allergic rhinitis due to pollen: Secondary | ICD-10-CM | POA: Diagnosis not present

## 2014-02-28 DIAGNOSIS — J040 Acute laryngitis: Secondary | ICD-10-CM | POA: Diagnosis not present

## 2014-02-28 DIAGNOSIS — J330 Polyp of nasal cavity: Secondary | ICD-10-CM | POA: Diagnosis not present

## 2014-02-28 DIAGNOSIS — R0982 Postnasal drip: Secondary | ICD-10-CM | POA: Diagnosis not present

## 2014-03-01 DIAGNOSIS — J040 Acute laryngitis: Secondary | ICD-10-CM | POA: Diagnosis not present

## 2014-03-01 DIAGNOSIS — R0982 Postnasal drip: Secondary | ICD-10-CM | POA: Diagnosis not present

## 2014-03-01 DIAGNOSIS — J330 Polyp of nasal cavity: Secondary | ICD-10-CM | POA: Diagnosis not present

## 2014-03-01 DIAGNOSIS — J301 Allergic rhinitis due to pollen: Secondary | ICD-10-CM | POA: Diagnosis not present

## 2014-03-02 DIAGNOSIS — J330 Polyp of nasal cavity: Secondary | ICD-10-CM | POA: Diagnosis not present

## 2014-03-02 DIAGNOSIS — R0982 Postnasal drip: Secondary | ICD-10-CM | POA: Diagnosis not present

## 2014-03-02 DIAGNOSIS — J040 Acute laryngitis: Secondary | ICD-10-CM | POA: Diagnosis not present

## 2014-03-02 DIAGNOSIS — J301 Allergic rhinitis due to pollen: Secondary | ICD-10-CM | POA: Diagnosis not present

## 2014-03-08 DIAGNOSIS — R490 Dysphonia: Secondary | ICD-10-CM | POA: Diagnosis not present

## 2014-03-08 DIAGNOSIS — R499 Unspecified voice and resonance disorder: Secondary | ICD-10-CM | POA: Diagnosis not present

## 2014-03-15 DIAGNOSIS — K311 Adult hypertrophic pyloric stenosis: Secondary | ICD-10-CM | POA: Diagnosis not present

## 2014-03-15 DIAGNOSIS — R1310 Dysphagia, unspecified: Secondary | ICD-10-CM | POA: Diagnosis not present

## 2014-03-15 DIAGNOSIS — R079 Chest pain, unspecified: Secondary | ICD-10-CM | POA: Diagnosis not present

## 2014-03-15 DIAGNOSIS — R933 Abnormal findings on diagnostic imaging of other parts of digestive tract: Secondary | ICD-10-CM | POA: Diagnosis not present

## 2014-03-21 DIAGNOSIS — J301 Allergic rhinitis due to pollen: Secondary | ICD-10-CM | POA: Diagnosis not present

## 2014-03-28 DIAGNOSIS — J301 Allergic rhinitis due to pollen: Secondary | ICD-10-CM | POA: Diagnosis not present

## 2014-04-03 DIAGNOSIS — R933 Abnormal findings on diagnostic imaging of other parts of digestive tract: Secondary | ICD-10-CM | POA: Diagnosis not present

## 2014-04-03 DIAGNOSIS — R1310 Dysphagia, unspecified: Secondary | ICD-10-CM | POA: Diagnosis not present

## 2014-04-05 DIAGNOSIS — J301 Allergic rhinitis due to pollen: Secondary | ICD-10-CM | POA: Diagnosis not present

## 2014-04-11 DIAGNOSIS — J301 Allergic rhinitis due to pollen: Secondary | ICD-10-CM | POA: Diagnosis not present

## 2014-04-19 DIAGNOSIS — J301 Allergic rhinitis due to pollen: Secondary | ICD-10-CM | POA: Diagnosis not present

## 2014-04-26 DIAGNOSIS — J301 Allergic rhinitis due to pollen: Secondary | ICD-10-CM | POA: Diagnosis not present

## 2014-05-03 DIAGNOSIS — J301 Allergic rhinitis due to pollen: Secondary | ICD-10-CM | POA: Diagnosis not present

## 2014-05-09 DIAGNOSIS — J301 Allergic rhinitis due to pollen: Secondary | ICD-10-CM | POA: Diagnosis not present

## 2014-05-16 DIAGNOSIS — J301 Allergic rhinitis due to pollen: Secondary | ICD-10-CM | POA: Diagnosis not present

## 2014-05-18 DIAGNOSIS — J301 Allergic rhinitis due to pollen: Secondary | ICD-10-CM | POA: Diagnosis not present

## 2014-05-24 ENCOUNTER — Ambulatory Visit (INDEPENDENT_AMBULATORY_CARE_PROVIDER_SITE_OTHER): Payer: Medicare Other | Admitting: Internal Medicine

## 2014-05-24 ENCOUNTER — Encounter: Payer: Self-pay | Admitting: Internal Medicine

## 2014-05-24 VITALS — BP 118/74 | HR 80 | Temp 98.4°F | Ht 59.75 in | Wt 109.8 lb

## 2014-05-24 DIAGNOSIS — R5381 Other malaise: Secondary | ICD-10-CM

## 2014-05-24 DIAGNOSIS — R0609 Other forms of dyspnea: Secondary | ICD-10-CM | POA: Insufficient documentation

## 2014-05-24 DIAGNOSIS — R5383 Other fatigue: Secondary | ICD-10-CM | POA: Diagnosis not present

## 2014-05-24 DIAGNOSIS — R0989 Other specified symptoms and signs involving the circulatory and respiratory systems: Secondary | ICD-10-CM

## 2014-05-24 DIAGNOSIS — J301 Allergic rhinitis due to pollen: Secondary | ICD-10-CM | POA: Diagnosis not present

## 2014-05-24 LAB — CBC WITH DIFFERENTIAL/PLATELET
Basophils Absolute: 0 10*3/uL (ref 0.0–0.1)
Basophils Relative: 0.5 % (ref 0.0–3.0)
EOS PCT: 5.4 % — AB (ref 0.0–5.0)
Eosinophils Absolute: 0.4 10*3/uL (ref 0.0–0.7)
HEMATOCRIT: 37.7 % (ref 36.0–46.0)
HEMOGLOBIN: 12.5 g/dL (ref 12.0–15.0)
LYMPHS ABS: 2.1 10*3/uL (ref 0.7–4.0)
Lymphocytes Relative: 29.6 % (ref 12.0–46.0)
MCHC: 33.2 g/dL (ref 30.0–36.0)
MCV: 91.8 fl (ref 78.0–100.0)
MONOS PCT: 7.4 % (ref 3.0–12.0)
Monocytes Absolute: 0.5 10*3/uL (ref 0.1–1.0)
NEUTROS ABS: 4 10*3/uL (ref 1.4–7.7)
Neutrophils Relative %: 57.1 % (ref 43.0–77.0)
Platelets: 416 10*3/uL — ABNORMAL HIGH (ref 150.0–400.0)
RBC: 4.11 Mil/uL (ref 3.87–5.11)
RDW: 13 % (ref 11.5–15.5)
WBC: 7 10*3/uL (ref 4.0–10.5)

## 2014-05-24 LAB — COMPREHENSIVE METABOLIC PANEL
ALT: 12 U/L (ref 0–35)
AST: 18 U/L (ref 0–37)
Albumin: 3.7 g/dL (ref 3.5–5.2)
Alkaline Phosphatase: 46 U/L (ref 39–117)
BILIRUBIN TOTAL: 0.3 mg/dL (ref 0.2–1.2)
BUN: 9 mg/dL (ref 6–23)
CHLORIDE: 103 meq/L (ref 96–112)
CO2: 30 mEq/L (ref 19–32)
CREATININE: 0.6 mg/dL (ref 0.4–1.2)
Calcium: 9.2 mg/dL (ref 8.4–10.5)
GFR: 111.78 mL/min (ref >60.00)
GLUCOSE: 87 mg/dL (ref 70–99)
Potassium: 4.9 mEq/L (ref 3.5–5.1)
Sodium: 138 mEq/L (ref 135–145)
Total Protein: 6.9 g/dL (ref 6.0–8.3)

## 2014-05-24 LAB — HEMOGLOBIN A1C: Hgb A1c MFr Bld: 5.7 % (ref 4.6–6.5)

## 2014-05-24 LAB — VITAMIN B12: VITAMIN B 12: 673 pg/mL (ref 211–911)

## 2014-05-24 LAB — TSH: TSH: 1.32 u[IU]/mL (ref 0.35–4.50)

## 2014-05-24 NOTE — Assessment & Plan Note (Signed)
Mild dyspnea noted with exertion. Will set up ECHO to evaluate for LV dysfunction. If normal, consider PFTs. Follow up 4 weeks and prn.

## 2014-05-24 NOTE — Assessment & Plan Note (Signed)
Pt complains of generalized fatigue. Exam is normal. Labs including CBC, CMP, B12, TSH normal. Will get ECHO given some dyspnea with exertion. Consider pulmonary evaluation if ECHO normal. Discussed sleep study if other testing normal, however pt prefers to hold off for now.

## 2014-05-24 NOTE — Progress Notes (Signed)
Subjective:    Patient ID: Natalie [name], female    DOB: [birth year], 68 y.o.   MRN: [id number]  HPI 68YO female presents for acute visit.  Feels "exhausted." Falls asleep in the daytime when sitting still. Ongoing for many months. No focal symptoms. 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 Has very mild dyspnea with exercise, but no change in this. No chest pain.  Sleep at night described as good. Falls asleep about 12am then wakes at about 8am. Generally, no interruptions to sleep. No snoring. No major changes in bowel habits. Occasional constipation. No blood in stool. Colonoscopy UTD. Appetite is good. Stays hungry frequently.  Review of Systems  Constitutional: Positive for fatigue. Negative for fever, chills, appetite change and unexpected weight change.  HENT: Negative for congestion, ear pain, sinus pressure, sore throat, trouble swallowing and voice change.   Eyes: Negative for visual disturbance.  Respiratory: Positive for shortness of breath. Negative for cough, wheezing and stridor.   Cardiovascular: Negative for chest pain, palpitations and leg swelling.  Gastrointestinal: Negative for nausea, vomiting, abdominal pain, diarrhea, constipation, blood in stool, abdominal distention and anal bleeding.  Genitourinary: Negative for dysuria and flank pain.  Musculoskeletal: Negative for arthralgias, gait problem, myalgias and neck pain.  Skin: Negative for color change and rash.  Neurological: Negative for dizziness and headaches.  Hematological: Negative for adenopathy. Does not bruise/bleed easily.  Psychiatric/Behavioral: Negative for suicidal ideas, sleep disturbance and dysphoric mood. The patient is not nervous/anxious.        Objective:    BP 118/74  Pulse 80  Temp(Src) 98.4 F (36.9 C) (Oral)  Ht 4' 11.75" (1.518 m)  Wt 109 lb 12 oz (49.782 kg)  BMI 21.60 kg/m2  SpO2 96% Physical Exam  Constitutional: She is oriented to person, place, and time. She  appears well-developed and well-nourished. No distress.  HENT:  Head: Normocephalic and atraumatic.  Right Ear: External ear normal.  Left Ear: External ear normal.  Nose: Nose normal.  Mouth/Throat: Oropharynx is clear and moist. No oropharyngeal exudate.  Eyes: Conjunctivae are normal. Pupils are equal, round, and reactive to light. Right eye exhibits no discharge. Left eye exhibits no discharge. No scleral icterus.  Neck: Normal range of motion. Neck supple. No tracheal deviation present. No thyromegaly present.  Cardiovascular: Normal rate, regular rhythm, normal heart sounds and intact distal pulses.  Exam reveals no gallop and no friction rub.   No murmur heard. Pulmonary/Chest: Effort normal and breath sounds normal. No respiratory distress. She has no wheezes. She has no rales. She exhibits no tenderness.  Musculoskeletal: Normal range of motion. She exhibits no edema and no tenderness.  Lymphadenopathy:    She has no cervical adenopathy.  Neurological: She is alert and oriented to person, place, and time. No cranial nerve deficit. She exhibits normal muscle tone. Coordination normal.  Skin: Skin is warm and dry. No rash noted. She is not diaphoretic. No erythema. No pallor.  Psychiatric: She has a normal mood and affect. Her behavior is normal. Judgment and thought content normal.          Assessment & Plan:   Problem List Items Addressed This Visit     Unprioritized   Dyspnea on exertion     Mild dyspnea noted with exertion. Will set up ECHO to evaluate for LV dysfunction. If normal, consider PFTs. Follow up 4 weeks and prn.    Relevant Orders      2D Echocardiogram without contrast   Other  malaise and fatigue - Primary     Pt complains of generalized fatigue. Exam is normal. Labs including CBC, CMP, B12, TSH normal. Will get ECHO given some dyspnea with exertion. Consider pulmonary evaluation if ECHO normal. Discussed sleep study if other testing normal, however pt  prefers to hold off for now.    Relevant Orders      TSH (Completed)      Comprehensive metabolic panel (Completed)      CBC with Differential (Completed)      B12 (Completed)      Hemoglobin A1c (Completed)       Return in about 4 weeks (around 06/21/2014) for Recheck fatigue.

## 2014-05-24 NOTE — Progress Notes (Signed)
Pre visit review using our clinic review tool, if applicable. No additional management support is needed unless otherwise documented below in the visit note. 

## 2014-05-31 ENCOUNTER — Ambulatory Visit: Payer: Self-pay | Admitting: Internal Medicine

## 2014-05-31 ENCOUNTER — Other Ambulatory Visit: Payer: Self-pay

## 2014-05-31 ENCOUNTER — Telehealth: Payer: Self-pay | Admitting: Internal Medicine

## 2014-05-31 DIAGNOSIS — I079 Rheumatic tricuspid valve disease, unspecified: Secondary | ICD-10-CM | POA: Diagnosis not present

## 2014-05-31 DIAGNOSIS — R0609 Other forms of dyspnea: Secondary | ICD-10-CM

## 2014-05-31 DIAGNOSIS — I379 Nonrheumatic pulmonary valve disorder, unspecified: Secondary | ICD-10-CM | POA: Diagnosis not present

## 2014-05-31 DIAGNOSIS — J301 Allergic rhinitis due to pollen: Secondary | ICD-10-CM | POA: Diagnosis not present

## 2014-05-31 DIAGNOSIS — I059 Rheumatic mitral valve disease, unspecified: Secondary | ICD-10-CM | POA: Diagnosis not present

## 2014-05-31 NOTE — Telephone Encounter (Signed)
LVM notifying pt of normal results

## 2014-05-31 NOTE — Telephone Encounter (Signed)
Cardiac ECHO was normal.

## 2014-06-12 ENCOUNTER — Encounter: Payer: Self-pay | Admitting: Internal Medicine

## 2014-06-13 DIAGNOSIS — J301 Allergic rhinitis due to pollen: Secondary | ICD-10-CM | POA: Diagnosis not present

## 2014-06-22 ENCOUNTER — Ambulatory Visit: Payer: Medicare Other | Admitting: Internal Medicine

## 2014-06-27 DIAGNOSIS — J301 Allergic rhinitis due to pollen: Secondary | ICD-10-CM | POA: Diagnosis not present

## 2014-07-05 DIAGNOSIS — J301 Allergic rhinitis due to pollen: Secondary | ICD-10-CM | POA: Diagnosis not present

## 2014-07-18 DIAGNOSIS — J301 Allergic rhinitis due to pollen: Secondary | ICD-10-CM | POA: Diagnosis not present

## 2014-07-23 ENCOUNTER — Other Ambulatory Visit: Payer: Self-pay | Admitting: Internal Medicine

## 2014-07-25 DIAGNOSIS — J301 Allergic rhinitis due to pollen: Secondary | ICD-10-CM | POA: Diagnosis not present

## 2014-07-27 ENCOUNTER — Ambulatory Visit: Payer: Medicare Other | Admitting: Internal Medicine

## 2014-08-01 DIAGNOSIS — J301 Allergic rhinitis due to pollen: Secondary | ICD-10-CM | POA: Diagnosis not present

## 2014-08-09 DIAGNOSIS — J301 Allergic rhinitis due to pollen: Secondary | ICD-10-CM | POA: Diagnosis not present

## 2014-08-10 DIAGNOSIS — J301 Allergic rhinitis due to pollen: Secondary | ICD-10-CM | POA: Diagnosis not present

## 2014-08-15 DIAGNOSIS — J301 Allergic rhinitis due to pollen: Secondary | ICD-10-CM | POA: Diagnosis not present

## 2014-08-16 ENCOUNTER — Ambulatory Visit: Payer: Medicare Other | Admitting: Internal Medicine

## 2014-08-22 DIAGNOSIS — J301 Allergic rhinitis due to pollen: Secondary | ICD-10-CM | POA: Diagnosis not present

## 2014-08-23 ENCOUNTER — Other Ambulatory Visit: Payer: Self-pay | Admitting: Internal Medicine

## 2014-08-30 DIAGNOSIS — J301 Allergic rhinitis due to pollen: Secondary | ICD-10-CM | POA: Diagnosis not present

## 2014-09-05 DIAGNOSIS — J301 Allergic rhinitis due to pollen: Secondary | ICD-10-CM | POA: Diagnosis not present

## 2014-09-06 DIAGNOSIS — J301 Allergic rhinitis due to pollen: Secondary | ICD-10-CM | POA: Diagnosis not present

## 2014-09-06 DIAGNOSIS — J339 Nasal polyp, unspecified: Secondary | ICD-10-CM | POA: Diagnosis not present

## 2014-09-06 DIAGNOSIS — J45909 Unspecified asthma, uncomplicated: Secondary | ICD-10-CM | POA: Diagnosis not present

## 2014-09-12 DIAGNOSIS — J301 Allergic rhinitis due to pollen: Secondary | ICD-10-CM | POA: Diagnosis not present

## 2014-09-16 ENCOUNTER — Other Ambulatory Visit: Payer: Self-pay | Admitting: Internal Medicine

## 2014-09-19 DIAGNOSIS — J301 Allergic rhinitis due to pollen: Secondary | ICD-10-CM | POA: Diagnosis not present

## 2014-09-25 ENCOUNTER — Telehealth: Payer: Self-pay

## 2014-09-25 NOTE — Telephone Encounter (Signed)
The patient called and is hoping to be worked in for ongoing symptoms.  She states she is continuing to have low blood pressure and fatigue.  She was offered your next available apt, however, she is hoping she can be worked in sooner.   [phone]

## 2014-09-25 NOTE — Telephone Encounter (Signed)
1pm Monday 10/19 for 33min

## 2014-09-25 NOTE — Telephone Encounter (Signed)
Pt scheduled and notified (lvmom)

## 2014-09-26 DIAGNOSIS — J301 Allergic rhinitis due to pollen: Secondary | ICD-10-CM | POA: Diagnosis not present

## 2014-10-01 ENCOUNTER — Encounter: Payer: Self-pay | Admitting: Internal Medicine

## 2014-10-01 ENCOUNTER — Ambulatory Visit (INDEPENDENT_AMBULATORY_CARE_PROVIDER_SITE_OTHER): Payer: Medicare Other | Admitting: Internal Medicine

## 2014-10-01 ENCOUNTER — Ambulatory Visit (INDEPENDENT_AMBULATORY_CARE_PROVIDER_SITE_OTHER)
Admission: RE | Admit: 2014-10-01 | Discharge: 2014-10-01 | Disposition: A | Discharge status: Home / Self Care | Payer: Medicare Other | Source: Ambulatory Visit | Attending: Internal Medicine | Admitting: Internal Medicine

## 2014-10-01 VITALS — BP 124/70 | HR 62 | Temp 98.0°F | Ht 59.75 in | Wt 110.5 lb

## 2014-10-01 DIAGNOSIS — R5382 Chronic fatigue, unspecified: Secondary | ICD-10-CM | POA: Diagnosis not present

## 2014-10-01 DIAGNOSIS — Z23 Encounter for immunization: Secondary | ICD-10-CM

## 2014-10-01 DIAGNOSIS — R0609 Other forms of dyspnea: Secondary | ICD-10-CM | POA: Diagnosis not present

## 2014-10-01 DIAGNOSIS — R0600 Dyspnea, unspecified: Secondary | ICD-10-CM | POA: Diagnosis not present

## 2014-10-01 LAB — CBC WITH DIFFERENTIAL/PLATELET
Basophils Absolute: 0 10*3/uL (ref 0.0–0.1)
Basophils Relative: 0.5 % (ref 0.0–3.0)
EOS PCT: 2.2 % (ref 0.0–5.0)
Eosinophils Absolute: 0.2 10*3/uL (ref 0.0–0.7)
HCT: 37.7 % (ref 36.0–46.0)
HEMOGLOBIN: 12.4 g/dL (ref 12.0–15.0)
Lymphocytes Relative: 20.5 % (ref 12.0–46.0)
Lymphs Abs: 2 10*3/uL (ref 0.7–4.0)
MCHC: 32.8 g/dL (ref 30.0–36.0)
MCV: 90.8 fl (ref 78.0–100.0)
MONOS PCT: 5.6 % (ref 3.0–12.0)
Monocytes Absolute: 0.5 10*3/uL (ref 0.1–1.0)
Neutro Abs: 6.8 10*3/uL (ref 1.4–7.7)
Neutrophils Relative %: 71.2 % (ref 43.0–77.0)
Platelets: 402 10*3/uL — ABNORMAL HIGH (ref 150.0–400.0)
RBC: 4.15 Mil/uL (ref 3.87–5.11)
RDW: 12.7 % (ref 11.5–15.5)
WBC: 9.5 10*3/uL (ref 4.0–10.5)

## 2014-10-01 LAB — COMPREHENSIVE METABOLIC PANEL
ALT: 15 U/L (ref 0–35)
AST: 23 U/L (ref 0–37)
Albumin: 3.5 g/dL (ref 3.5–5.2)
Alkaline Phosphatase: 49 U/L (ref 39–117)
BILIRUBIN TOTAL: 0.6 mg/dL (ref 0.2–1.2)
BUN: 11 mg/dL (ref 6–23)
CO2: 24 meq/L (ref 19–32)
Calcium: 9.4 mg/dL (ref 8.4–10.5)
Chloride: 104 mEq/L (ref 96–112)
Creatinine, Ser: 0.6 mg/dL (ref 0.4–1.2)
GFR: 109.44 mL/min (ref >60.00)
GLUCOSE: 96 mg/dL (ref 70–99)
Potassium: 4.6 mEq/L (ref 3.5–5.1)
Sodium: 142 mEq/L (ref 135–145)
Total Protein: 7.4 g/dL (ref 6.0–8.3)

## 2014-10-01 LAB — VITAMIN B12: VITAMIN B 12: 646 pg/mL (ref 211–911)

## 2014-10-01 LAB — TSH: TSH: 1.59 u[IU]/mL (ref 0.35–4.50)

## 2014-10-01 LAB — C-REACTIVE PROTEIN

## 2014-10-01 LAB — SEDIMENTATION RATE: SED RATE: 19 mm/h (ref 0–22)

## 2014-10-01 NOTE — Assessment & Plan Note (Signed)
Intermittent episodes of dyspnea on exertion. Recent ECHO and stress test were normal. Will set up pulmonary evaluation for possible PFTS.

## 2014-10-01 NOTE — Progress Notes (Signed)
Pre visit review using our clinic review tool, if applicable. No additional management support is needed unless otherwise documented below in the visit note. 

## 2014-10-01 NOTE — Assessment & Plan Note (Addendum)
Intermittent episodes of severe fatigue over the last year. Unclear etiology. Cardiac workup with ECHO and treadmill stress test were normal. Lab evaluation including CBC, CMP, TSH was normal. Only noted symptom is occasional dyspnea with exertion. Will check CXR today. Repeat labs including CBC, CMP, B12, TSH. Will set up pulmonary evaluation for possible repeat PFTs. Encouraged her to consider a sleep study. Follow up in 4 weeks and prn.

## 2014-10-01 NOTE — Patient Instructions (Addendum)
Labs today.  We will set up a pulmonary evaluation.

## 2014-10-01 NOTE — Progress Notes (Signed)
Subjective:    Patient ID: Natalie [name], female    DOB: 1945-12-16, 69 y.o.   MRN: [id number]  HPI 68YO female presents for acute visit.  Fatigue - Last week, felt extremely fatigued, short of breath on exertion. Checked BP and it was 98/59 with HR 58. Felt better today. Able to instruct aerobics. No chest pain or shortness of breath with this. No GI symptoms. Feels as tired on waking as when goes to bed. Feels so tired that she might die at times. Occasionally has diffuse pain throughout body which resolves with Tylenol. No recent fever, chills. No weakness. Denies anxiety or depression.  Review of Systems  Constitutional: Positive for fatigue. Negative for fever, chills, appetite change and unexpected weight change.  Eyes: Negative for visual disturbance.  Respiratory: Positive for shortness of breath (with exertion).   Cardiovascular: Negative for chest pain and leg swelling.  Gastrointestinal: Negative for nausea, vomiting, abdominal pain, diarrhea, constipation and blood in stool.  Musculoskeletal: Positive for myalgias (right shoulder, today). Negative for arthralgias.  Skin: Negative for color change and rash.  Hematological: Negative for adenopathy. Does not bruise/bleed easily.  Psychiatric/Behavioral: Negative for suicidal ideas, sleep disturbance and dysphoric mood. The patient is not nervous/anxious.        Objective:    BP 124/70  Pulse 62  Temp(Src) 98 F (36.7 C) (Oral)  Ht 4' 11.75" (1.518 m)  Wt 110 lb 8 oz (50.122 kg)  BMI 21.75 kg/m2  SpO2 99% Physical Exam  Constitutional: She is oriented to person, place, and time. She appears well-developed and well-nourished. No distress.  HENT:  Head: Normocephalic and atraumatic.  Right Ear: External ear normal.  Left Ear: External ear normal.  Nose: Nose normal.  Mouth/Throat: Oropharynx is clear and moist. No oropharyngeal exudate.  Eyes: Conjunctivae are normal. Pupils are equal, round, and reactive to  light. Right eye exhibits no discharge. Left eye exhibits no discharge. No scleral icterus.  Neck: Normal range of motion. Neck supple. No tracheal deviation present. No thyromegaly present.  Cardiovascular: Normal rate, regular rhythm, normal heart sounds and intact distal pulses.  Exam reveals no gallop and no friction rub.   No murmur heard. Pulmonary/Chest: Effort normal and breath sounds normal. No accessory muscle usage. Not tachypneic. No respiratory distress. She has no decreased breath sounds. She has no wheezes. She has no rhonchi. She has no rales. She exhibits no tenderness.  Musculoskeletal: Normal range of motion. She exhibits no edema and no tenderness.  Lymphadenopathy:    She has no cervical adenopathy.  Neurological: She is alert and oriented to person, place, and time. No cranial nerve deficit. She exhibits normal muscle tone. Coordination normal.  Skin: Skin is warm and dry. No rash noted. She is not diaphoretic. No erythema. No pallor.  Psychiatric: She has a normal mood and affect. Her behavior is normal. Judgment and thought content normal.          Assessment & Plan:   Problem List Items Addressed This Visit     Unprioritized   Dyspnea on exertion     Intermittent episodes of dyspnea on exertion. Recent ECHO and stress test were normal. Will set up pulmonary evaluation for possible PFTS.    Relevant Orders      DG Chest 2 View      Ambulatory referral to Pulmonology   Fatigue - Primary     Intermittent episodes of severe fatigue over the last year. Unclear etiology. Cardiac workup with ECHO and  treadmill stress test were normal. Lab evaluation including CBC, CMP, TSH was normal. Only noted symptom is occasional dyspnea with exertion. Will check CXR today. Repeat labs including CBC, CMP, B12, TSH. Will set up pulmonary evaluation for possible repeat PFTs. Encouraged her to consider a sleep study. Follow up in 4 weeks and prn.    Relevant Orders      CBC w/Diff        Comprehensive metabolic panel      W80      TSH      Sed Rate (ESR)      C-reactive protein      ANA    Other Visit Diagnoses   Encounter for immunization            Return in about 4 weeks (around 10/29/2014) for [place], Wellness Visit.

## 2014-10-02 LAB — ANA: Anti Nuclear Antibody(ANA): NEGATIVE

## 2014-10-03 DIAGNOSIS — J301 Allergic rhinitis due to pollen: Secondary | ICD-10-CM | POA: Diagnosis not present

## 2014-10-11 DIAGNOSIS — J301 Allergic rhinitis due to pollen: Secondary | ICD-10-CM | POA: Diagnosis not present

## 2014-10-17 ENCOUNTER — Institutional Professional Consult (permissible substitution): Payer: Medicare Other | Admitting: Internal Medicine

## 2014-10-17 DIAGNOSIS — J301 Allergic rhinitis due to pollen: Secondary | ICD-10-CM | POA: Diagnosis not present

## 2014-10-23 ENCOUNTER — Ambulatory Visit (INDEPENDENT_AMBULATORY_CARE_PROVIDER_SITE_OTHER): Payer: Medicare Other | Admitting: Internal Medicine

## 2014-10-23 ENCOUNTER — Encounter: Payer: Self-pay | Admitting: Internal Medicine

## 2014-10-23 VITALS — BP 112/64 | Ht 59.5 in | Wt 110.0 lb

## 2014-10-23 DIAGNOSIS — R0600 Dyspnea, unspecified: Secondary | ICD-10-CM | POA: Insufficient documentation

## 2014-10-23 NOTE — Assessment & Plan Note (Signed)
Differential includes: Obstructive versus restrictive disease, poor sleep hygiene, possible obstructive sleep apnea, deconditioning, vocal cord dysfunction, anxiety  Plan: -will plan for pulmonary function testing to evaluate for further obstructive with restrictive disease and also a 6 minute walk test. -Evaluate flow loops on pulmonary function testing for further intrathoracic verses extrathoracic causes of possible obstruction and to evaluate for vocal cord dysfunction. -Will also order a sleep study based on her level of fatigue, clinical signs, Epworth Sleepiness Scale. -May consider CAT scan of chest based on pulmonary function testing results, especially if there is a restrictive pattern.

## 2014-10-23 NOTE — Progress Notes (Signed)
Date: 10/23/2014  MRN# [id number] Natalie [name] [birth year]  Referring Physician:   LUVERN MISCHKE is a 68 y.o. old female seen in consultation for   CC: "intermittent episodes of shortness of breath and fatigue" Chief Complaint  Patient presents with  . Advice Only    Patient has sob with exhertion. She is a Freight forwarder therefore active daily. Patient has slight wheeze at times. Patient denies cough and chest tightness.    HPI:  Patient is a pleasant 68 year old female with past medical history of esophagus spasm status post dilation on baclofen, chronic sinusitis on fluticasone,seen in consultation for intermittent episodes of shortness of breath with exertion for the past 3-4 months. History per review of records and the patient. Patient cannot identify any major inciting factor 3-4 months ago neck also dyspnea on exertion. Patient states that she has difficulty sometime doing her daily chores such as cleaning the kitchen and bathroom, she develops shortness of breath shortly afterwards that last for a few minutes. 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 She has a history of chronic sinusitis, and has had sinus surgery done by ENT, Dr. Tami Ribas, who also placed her on Symbicort as needed for shortness of breath. Patient states she uses Symbicort as a rescue medication and it does provide relief. She has tried albuterol in the past and develops nervousness and shaking. Patient states she has dyspnea after climbing 2 flights of stairs. She is a never smoker, but has had exposure to secondhand smoke for a number of years throughout her life. She has a family history significant for emphysema at an early age especially with her father and his siblings.  She denies any fevers, night sweats, chills, or rapid weight loss. She admits to intermittent cough, mostly at night with clear  sputum, this occurs maybe 2-3 times per week. Patient also stated today that she has problems with fatigue over this same time period. Patient states that she goes to bed about 11:30 PM but will repeat her phone, be on the computer, watch TV, and will not actually fall asleep until around 1:30 AM. She states that she gets 6-7 hours of sleep per night. Patient has a history of esophageal sphincter spasm and esophageal strictures, has had dilation in the past and is currently on baclofen.     Obstructive Sleep Apnea Screening The patient was screened with the STOP-BANG questionnaire. >3 positive responses is considered a positive screen  Snoring  Unsure  Tiredness  YES Observed Apnea Unsure Pressure (HTN) NO BMI >35  NO Age > 50  YES Neck >17"  NO Gender (female) FEMALE  Total:  2-4  Screen: equivocal  Epworth Sleepiness Scale Score  0-10 Normal 10-12 Borderline 12-24 Abnormal  Patient Score = 17     PMHX:   Past Medical History  Diagnosis Date  . Allergy   . Nasal polyps     followed by Dr Tami Ribas  . Chest pain    Surgical Hx:  Past Surgical History  Procedure Laterality Date  . Vaginal delivery      x 3  . Abdominal hysterectomy  1984    due to fibroids  . Appendectomy  1952  . Tonsillectomy  1950  . Nasal sinus surgery  1988, 2008  . Hemorroidectomy  1995   Family Hx:  Family History  Problem Relation Age of Onset  . Heart disease Mother   .  COPD Father   . Heart disease Brother   . COPD Brother   . Cancer Daughter     Ovary  . Heart disease Maternal Grandmother   . Heart disease Maternal Grandfather   . Cancer Paternal Grandmother     Ovary - 51's and again in 49's  . COPD Paternal Grandfather    Social Hx:   History  Substance Use Topics  . Smoking status: Never Smoker   . Smokeless tobacco: Never Used  . Alcohol Use: No   Medication:   Current Outpatient Rx  Name  Route  Sig  Dispense  Refill  . aspirin EC 81 MG tablet   Oral   Take 81  mg by mouth daily.         . baclofen (LIORESAL) 10 MG tablet   Oral   Take 10 mg by mouth 2 (two) times daily.         . budesonide-formoterol (SYMBICORT) 80-4.5 MCG/ACT inhaler   Inhalation   Inhale 2 puffs into the lungs 2 (two) times daily as needed.         . Cholecalciferol (VITAMIN D-3) 1000 UNITS CAPS   Oral   Take 1,000 each by mouth daily.         Marland Kitchen estradiol (ESTRACE) 0.5 MG tablet      TAKE 1 TABLET BY MOUTH EVERY DAY   30 tablet   5   . fluticasone (FLONASE) 50 MCG/ACT nasal spray   Nasal   Place 2 sprays into the nose daily.         . montelukast (SINGULAIR) 10 MG tablet   Oral   Take 10 mg by mouth daily.         . nitroGLYCERIN (NITROSTAT) 0.3 MG SL tablet   Sublingual   Place 1 tablet (0.3 mg total) under the tongue every 5 (five) minutes as needed for chest pain.   90 tablet   3       Allergies:  Aspirin; Clarithromycin; Codeine; Soma; and Tramadol  Review of Systems: Gen:  Denies  fever, sweats, chills HEENT: Denies blurred vision, double vision, ear pain, eye pain, hearing loss, nose bleeds, sore throat Cvc:  No dizziness, chest pain or heaviness Resp:   Admits to cough and shortness of breath Gi: Denies swallowing difficulty, stomach pain, nausea or vomiting, diarrhea, constipation, bowel incontinence Gu:  Denies bladder incontinence, burning urine Ext:   No Joint pain, stiffness or swelling Skin: No skin rash, easy bruising or bleeding or hives Endoc:  No polyuria, polydipsia , polyphagia or weight change Psych: No depression, insomnia or hallucinations  Other:  All other systems negative  Physical Examination:   VS: There were no vitals taken for this visit.  General Appearance: No distress  Neuro:without focal findings, mental status, speech normal, alert and oriented, cranial nerves 2-12 intact, reflexes normal and symmetric, sensation grossly normal  HEENT: PERRLA, EOM intact, no ptosis, no other lesions noticed;  Mallampati 1 Pulmonary: normal breath sounds., diaphragmatic excursion normal.No wheezing, No rales;   Sputum Production:   CardiovascularNormal S1,S2.  No m/r/g.  Abdominal aorta pulsation normal.    Abdomen: Benign, Soft, non-tender, No masses, hepatosplenomegaly, No lymphadenopathy Renal:  No costovertebral tenderness  GU:  No performed at this time. Endoc: No evident thyromegaly, no signs of acromegaly or Cushing features Skin:   warm, no rashes, no ecchymosis  Extremities: normal, no cyanosis, clubbing, no edema, warm with normal capillary refill. Other findings:none   Rad results: (The  following images and results were reviewed by Dr. Stevenson Clinch). Chest x-ray 10/01/2014 The heart size and mediastinal contours are within normal limits. Both lungs are clear. The visualized skeletal structures are unremarkable.  IMPRESSION: No active cardiopulmonary disease.   ECHO (05/2014): Left ventricular ejection fraction, by visual estimation, is 50-55% Low normal global left ventricular systolic function Mild to moderate mitral valve regurgitation Mild tricuspid regurgitation No evidence of pulmonary hypertension Left atrium is normal in size and structure Right atrium is normal in size and structure No evidence of pericardial effusion Mild to moderate mitral valve regurgitation is seen Tricuspid valve is normal Pulmonic valve is not well seen Aortic root is normal in size and structure     Assessment and Plan: Dyspnea Differential includes: Obstructive versus restrictive disease, poor sleep hygiene, possible obstructive sleep apnea, deconditioning, vocal cord dysfunction, anxiety  Plan: -will plan for pulmonary function testing to evaluate for further obstructive with restrictive disease and also a 6 minute walk test. -Evaluate flow loops on pulmonary function testing for further intrathoracic verses extrathoracic causes of possible obstruction and to evaluate for vocal cord  dysfunction. -Will also order a sleep study based on her level of fatigue, clinical signs, Epworth Sleepiness Scale. -May consider CAT scan of chest based on pulmonary function testing results, especially if there is a restrictive pattern.    Updated Medication List Outpatient Encounter Prescriptions as of 10/23/2014  Medication Sig  . aspirin EC 81 MG tablet Take 81 mg by mouth daily.  . baclofen (LIORESAL) 10 MG tablet Take 10 mg by mouth daily.   . budesonide-formoterol (SYMBICORT) 80-4.5 MCG/ACT inhaler Inhale 1 puff into the lungs 2 (two) times daily as needed.   . Cholecalciferol (VITAMIN D-3) 1000 UNITS CAPS Take 1,000 each by mouth daily.  . Coenzyme Q10 (CO Q-10) 100 MG CAPS Take 100 mg by mouth as needed.  Marland Kitchen EPIPEN 2-PAK 0.3 MG/0.3ML SOAJ injection Inject 0.3 mLs into the skin as needed.  Marland Kitchen estradiol (ESTRACE) 0.5 MG tablet TAKE 1 TABLET BY MOUTH EVERY DAY  . fluticasone (FLONASE) 50 MCG/ACT nasal spray Place 2 sprays into the nose daily.  Marland Kitchen ipratropium (ATROVENT) 0.06 % nasal spray Place 1 spray into both nostrils daily.  . montelukast (SINGULAIR) 10 MG tablet Take 10 mg by mouth daily.  . nitroGLYCERIN (NITROSTAT) 0.3 MG SL tablet Place 1 tablet (0.3 mg total) under the tongue every 5 (five) minutes as needed for chest pain.  . Omega-3 Fatty Acids (FISH OIL) 1000 MG CAPS Take 1 tablet by mouth as needed.    Orders for this visit: Orders Placed This Encounter  Procedures  . Ambulatory referral to Sleep Studies    Referral Priority:  Routine    Referral Type:  Consultation    Referral Reason:  Specialty Services Required    Number of Visits Requested:  1  . Pulmonary function test    Standing Status: Future     Number of Occurrences:      Standing Expiration Date: 10/24/2015    Scheduling Instructions:     Please set patient up for Full PFT and 6 min walk @ [facility]. Dx. Dyspnea    Order Specific Question:  Where should this test be performed?    Answer:  Other    Order  Specific Question:  Full PFT: includes the following: basic spirometry, spirometry pre & post bronchodilator, diffusion capacity (DLCO), lung volumes    Answer:  Full PFT    Order Specific Question:  MIP/MEP  Answer:  No    Order Specific Question:  6 minute walk    Answer:  Yes    Order Specific Question:  ABG    Answer:  No    Order Specific Question:  Diffusion capacity (DLCO)    Answer:  No    Order Specific Question:  Lung volumes    Answer:  No    Order Specific Question:  Methacholine challenge    Answer:  No     Thank  you for the consultation and for allowing [facility] to assist in the care of your patient. Our recommendations are noted above.  Please contact us if we can be of further service.   Vilinda Boehringer, MD [facility] Office Number: [phone]

## 2014-10-23 NOTE — Patient Instructions (Signed)
Patient will be scheduled for a breathing and walk test. Patient will be set up for a sleep study. Patient will follow up in 1 month.

## 2014-10-25 ENCOUNTER — Other Ambulatory Visit: Payer: Self-pay | Admitting: Internal Medicine

## 2014-10-25 DIAGNOSIS — R5383 Other fatigue: Secondary | ICD-10-CM

## 2014-10-25 DIAGNOSIS — J301 Allergic rhinitis due to pollen: Secondary | ICD-10-CM | POA: Diagnosis not present

## 2014-10-31 DIAGNOSIS — J301 Allergic rhinitis due to pollen: Secondary | ICD-10-CM | POA: Diagnosis not present

## 2014-11-06 DIAGNOSIS — J301 Allergic rhinitis due to pollen: Secondary | ICD-10-CM | POA: Diagnosis not present

## 2014-11-13 ENCOUNTER — Encounter: Payer: Medicare Other | Admitting: Internal Medicine

## 2014-11-14 DIAGNOSIS — J301 Allergic rhinitis due to pollen: Secondary | ICD-10-CM | POA: Diagnosis not present

## 2014-11-15 ENCOUNTER — Ambulatory Visit: Payer: Self-pay | Admitting: Internal Medicine

## 2014-11-15 DIAGNOSIS — J449 Chronic obstructive pulmonary disease, unspecified: Secondary | ICD-10-CM | POA: Diagnosis not present

## 2014-11-15 DIAGNOSIS — R0602 Shortness of breath: Secondary | ICD-10-CM | POA: Diagnosis not present

## 2014-11-20 LAB — PULMONARY FUNCTION TEST

## 2014-11-26 DIAGNOSIS — J301 Allergic rhinitis due to pollen: Secondary | ICD-10-CM | POA: Diagnosis not present

## 2014-12-03 DIAGNOSIS — J301 Allergic rhinitis due to pollen: Secondary | ICD-10-CM | POA: Diagnosis not present

## 2014-12-12 DIAGNOSIS — J301 Allergic rhinitis due to pollen: Secondary | ICD-10-CM | POA: Diagnosis not present

## 2014-12-19 ENCOUNTER — Ambulatory Visit: Payer: Self-pay | Admitting: Internal Medicine

## 2014-12-19 DIAGNOSIS — G478 Other sleep disorders: Secondary | ICD-10-CM | POA: Diagnosis not present

## 2014-12-19 DIAGNOSIS — R0683 Snoring: Secondary | ICD-10-CM | POA: Diagnosis not present

## 2014-12-19 DIAGNOSIS — R5383 Other fatigue: Secondary | ICD-10-CM | POA: Diagnosis not present

## 2014-12-19 DIAGNOSIS — J301 Allergic rhinitis due to pollen: Secondary | ICD-10-CM | POA: Diagnosis not present

## 2014-12-27 DIAGNOSIS — J301 Allergic rhinitis due to pollen: Secondary | ICD-10-CM | POA: Diagnosis not present

## 2015-01-02 DIAGNOSIS — L821 Other seborrheic keratosis: Secondary | ICD-10-CM | POA: Diagnosis not present

## 2015-01-02 DIAGNOSIS — D1801 Hemangioma of skin and subcutaneous tissue: Secondary | ICD-10-CM | POA: Diagnosis not present

## 2015-01-02 DIAGNOSIS — L989 Disorder of the skin and subcutaneous tissue, unspecified: Secondary | ICD-10-CM | POA: Diagnosis not present

## 2015-01-02 DIAGNOSIS — J301 Allergic rhinitis due to pollen: Secondary | ICD-10-CM | POA: Diagnosis not present

## 2015-01-02 DIAGNOSIS — D485 Neoplasm of uncertain behavior of skin: Secondary | ICD-10-CM | POA: Diagnosis not present

## 2015-01-07 ENCOUNTER — Ambulatory Visit (INDEPENDENT_AMBULATORY_CARE_PROVIDER_SITE_OTHER): Payer: Medicare Other | Admitting: Internal Medicine

## 2015-01-07 ENCOUNTER — Encounter: Payer: Self-pay | Admitting: Internal Medicine

## 2015-01-07 VITALS — BP 102/66 | HR 74 | Temp 97.7°F | Ht 60.0 in | Wt 110.0 lb

## 2015-01-07 DIAGNOSIS — R0600 Dyspnea, unspecified: Secondary | ICD-10-CM

## 2015-01-07 DIAGNOSIS — J449 Chronic obstructive pulmonary disease, unspecified: Secondary | ICD-10-CM

## 2015-01-07 MED ORDER — TIOTROPIUM BROMIDE MONOHYDRATE 2.5 MCG/ACT IN AERS: 2.5000 ug | INHALATION_SPRAY | Freq: Every day | RESPIRATORY_TRACT | Status: DC

## 2015-01-07 NOTE — Progress Notes (Signed)
MRN# [id number] Natalie [name] 12/28/1945  PMD - Dr. Ronette Deter  CC: Chief Complaint  Patient presents with  . Follow-up    pt here to follow up sleep study and PFT. She still has sob.      Brief History: HPI 10/23/2014 Patient is a pleasant 69 year old female with past medical history of esophagus spasm status post dilation on baclofen, chronic sinusitis on fluticasone,seen in consultation for intermittent episodes of shortness of breath with exertion for the past 3-4 months. History per review of records and the patient. Patient cannot identify any major inciting factor 3-4 months ago neck also dyspnea on exertion. Patient states that she has difficulty sometime doing her daily chores such as cleaning the kitchen and bathroom, she develops shortness of breath shortly afterwards that last for a few minutes. 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 She has a history of chronic sinusitis, and has had sinus surgery done by ENT, Dr. Tami Ribas, who also placed her on Symbicort as needed for shortness of breath. Patient states she uses Symbicort as a rescue medication and it does provide relief. She has tried albuterol in the past and develops nervousness and shaking. Patient states she has dyspnea after climbing 2 flights of stairs. She is a never smoker, but has had exposure to secondhand smoke for a number of years throughout her life. She has a family history significant for emphysema at an early age especially with her father and his siblings.  She denies any fevers, night sweats, chills, or rapid weight loss. She admits to intermittent cough, mostly at night with clear sputum, this occurs maybe 2-3 times per week. Patient also stated today that she has problems with fatigue over this same time period. Patient states that she goes to bed about 11:30 PM but will repeat her  phone, be on the computer, watch TV, and will not actually fall asleep until around 1:30 AM. She states that she gets 6-7 hours of sleep per night. Patient has a history of esophageal sphincter spasm and esophageal strictures, has had dilation in the past and is currently on baclofen.  PLAN - dyspnea - pfts, sleep study, sleep hygiene    Events since last clinic visit: Patient states that she is doing fairly well today, still with intermittent episodes of dyspnea (mostly with bending forward). Since her last visit she has had a 6MWT, PFTs and sleep study, for which she would like to discuss the results today. 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  Has a hx of secondhand exposure (previous marriage, and work environment). Patient states that she is going to sleep earlier now (1200am, prior 1.30am), no computer or playing games at night prior to bed; patient is still reading a book prior to bed. States that she is sleeping about 7.5hrs per night. Still using symbicort (about 1-2 times per week as needed).      PMHX:   Past Medical History  Diagnosis Date  . Allergy   . Nasal polyps     followed by Dr Tami Ribas  . Chest pain    Surgical Hx:  Past Surgical History  Procedure Laterality Date  . Vaginal delivery      x 3  . Abdominal hysterectomy  1984    due to fibroids  . Appendectomy  1952  . Tonsillectomy  1950  .  Nasal sinus surgery  1988, 2008  . Hemorroidectomy  1995   Family Hx:  Family History  Problem Relation Age of Onset  . Heart disease Mother   . COPD Father   . Heart disease Brother   . COPD Brother   . Cancer Daughter     Ovary  . Heart disease Maternal Grandmother   . Heart disease Maternal Grandfather   . Cancer Paternal Grandmother     Ovary - 55's and again in 40's  . COPD Paternal Grandfather    Social Hx:   History  Substance Use Topics  . Smoking  status: Never Smoker   . Smokeless tobacco: Never Used  . Alcohol Use: No   Medication:   Current Outpatient Rx  Name  Route  Sig  Dispense  Refill  . aspirin EC 81 MG tablet   Oral   Take 81 mg by mouth daily.         . baclofen (LIORESAL) 10 MG tablet   Oral   Take 10 mg by mouth daily.          . budesonide-formoterol (SYMBICORT) 80-4.5 MCG/ACT inhaler   Inhalation   Inhale 1 puff into the lungs 2 (two) times daily as needed.          . Cholecalciferol (VITAMIN D-3) 1000 UNITS CAPS   Oral   Take 1,000 each by mouth daily.         . Coenzyme Q10 (CO Q-10) 100 MG CAPS   Oral   Take 100 mg by mouth as needed.         Marland Kitchen EPIPEN 2-PAK 0.3 MG/0.3ML SOAJ injection   Subcutaneous   Inject 0.3 mLs into the skin as needed.      1   . estradiol (ESTRACE) 0.5 MG tablet      TAKE 1 TABLET BY MOUTH EVERY DAY   30 tablet   5   . fluticasone (FLONASE) 50 MCG/ACT nasal spray   Nasal   Place 2 sprays into the nose daily.         Marland Kitchen ipratropium (ATROVENT) 0.06 % nasal spray   Each Nare   Place 1 spray into both nostrils daily.      4   . montelukast (SINGULAIR) 10 MG tablet   Oral   Take 10 mg by mouth daily.         . nitroGLYCERIN (NITROSTAT) 0.3 MG SL tablet   Sublingual   Place 1 tablet (0.3 mg total) under the tongue every 5 (five) minutes as needed for chest pain.   90 tablet   3   . Omega-3 Fatty Acids (FISH OIL) 1000 MG CAPS   Oral   Take 1 tablet by mouth as needed.         . Tiotropium Bromide Monohydrate (SPIRIVA RESPIMAT) 2.5 MCG/ACT AERS   Inhalation   Inhale 2.5 mcg into the lungs daily.   4 g   6   . Tiotropium Bromide Monohydrate (SPIRIVA RESPIMAT) 2.5 MCG/ACT AERS   Inhalation   Inhale 2.5 mcg into the lungs daily.   4 g   0      Review of Systems: Gen:  Denies  fever, sweats, chills HEENT: Denies blurred vision, double vision, ear pain, eye pain, hearing loss, nose bleeds, sore throat Cvc:  No dizziness, chest pain or  heaviness Resp:   Mild DOE, mild dyspnea at rest Gi: Denies swallowing difficulty, stomach pain, nausea or vomiting, diarrhea, constipation, bowel incontinence  Gu:  Denies bladder incontinence, burning urine Ext:   No Joint pain, stiffness or swelling Skin: No skin rash, easy bruising or bleeding or hives Endoc:  No polyuria, polydipsia , polyphagia or weight change Psych: No depression, insomnia or hallucinations  Other:  All other systems negative  Allergies:  Aspirin; Amoxicillin-pot clavulanate; Clarithromycin; Codeine; Soma; and Tramadol  Physical Examination:  VS: BP 102/66 mmHg  Pulse 74  Temp(Src) 97.7 F (36.5 C) (Oral)  Ht 5' (1.524 m)  Wt 110 lb (49.896 kg)  BMI 21.48 kg/m2  SpO2 98%  General Appearance: No distress  Neuro: EXAM: without focal findings, mental status, speech normal, alert and oriented, cranial nerves 2-12 grossly normal  HEENT: PERRLA, EOM intact, no ptosis, no other lesions noticed Pulmonary:Exam: normal breath sounds., diaphragmatic excursion normal.No wheezing, No rales   Cardiovascular:@ Exam:  Normal S1,S2.  No m/r/g.     Abdomen:Exam: Benign, Soft, non-tender, No masses  Skin:   warm, no rashes, no ecchymosis  Extremities: normal, no cyanosis, clubbing, no edema, warm with normal capillary refill.   Labs results:  BMP Lab Results  Component Value Date   NA 142 10/01/2014   K 4.6 10/01/2014   CL 104 10/01/2014   CO2 24 10/01/2014   GLUCOSE 96 10/01/2014   BUN 11 10/01/2014   CREATININE 0.6 10/01/2014     CBC CBC Latest Ref Rng 10/01/2014 05/24/2014 11/07/2013  WBC 4.0 - 10.5 K/uL 9.5 7.0 6.1  Hemoglobin 12.0 - 15.0 g/dL 12.4 12.5 12.7  Hematocrit 36.0 - 46.0 % 37.7 37.7 37.4  Platelets 150.0 - 400.0 K/uL 402.0(H) 416.0(H) 400.0    The following results were reviewed with the patient:  PFT Results: 11/20/14 Spirometry: FVC: 2.6L 108%predicted FEV1: 1.47L 88%predicted FEV1/FVC: 57%predicted.   Diffusion: 107%predicted.   Lung  Volumes: VC: 2.6L 108%predicted RV: 1.61L 101%predicted TLC: 4.21L 105%predicted.   Volume/Flow Loops: scooping of expiratory limb.   Comments: Spirometric Data is Acceptable and Reproducible Fev1/FVc ratio, FEF 25/75 reduced. TLC elevated.   Assessment/Impression: Mild/Moderate Obstructive Lung Disease without significant bronchodilator response with hyperinflation, Clinical Correlation Advised.     PSG results 12/19/2014 Patient slept for 161 minutes. A sleep latency of over 2 hours. Possibility of insomnia the culprit for his symptoms. During the study there was no apnea oxygen saturation. There was mild presence of PLMS.   Assessment and Plan: COPD, mild Mild COPD - patient with mild symptoms (DOE), no significant obstruction on PFTs, however intermittent DOE even sometimes at rest.   Plan Mild copd - start spiriva (give one month trial), she has a hx of using symbicort PRN in the past with moderate relief of symptoms, we discuss giving a Spiriva a trial, if now significant improvement will then start symbicort as maintenance therapy. - PRN albuterol  - if no significant improvement at next visit will consider repeating an ECHO (has a hx of mild to mod MR, ECHO 05/28/14)   Dyspnea Most likely due to mild COPD  Plan - cont with exercise - see plan for MILD COPD - sleep hygiene techniques discuss with patient (PSG - no apnea, maybe insomnia).        Updated Medication List Outpatient Encounter Prescriptions as of 01/07/2015  Medication Sig  . aspirin EC 81 MG tablet Take 81 mg by mouth daily.  . baclofen (LIORESAL) 10 MG tablet Take 10 mg by mouth daily.   . budesonide-formoterol (SYMBICORT) 80-4.5 MCG/ACT inhaler Inhale 1 puff into the lungs 2 (two) times daily as needed.   Marland Kitchen  Cholecalciferol (VITAMIN D-3) 1000 UNITS CAPS Take 1,000 each by mouth daily.  . Coenzyme Q10 (CO Q-10) 100 MG CAPS Take 100 mg by mouth as needed.  Marland Kitchen EPIPEN 2-PAK 0.3 MG/0.3ML SOAJ injection  Inject 0.3 mLs into the skin as needed.  Marland Kitchen estradiol (ESTRACE) 0.5 MG tablet TAKE 1 TABLET BY MOUTH EVERY DAY  . fluticasone (FLONASE) 50 MCG/ACT nasal spray Place 2 sprays into the nose daily.  Marland Kitchen ipratropium (ATROVENT) 0.06 % nasal spray Place 1 spray into both nostrils daily.  . montelukast (SINGULAIR) 10 MG tablet Take 10 mg by mouth daily.  . nitroGLYCERIN (NITROSTAT) 0.3 MG SL tablet Place 1 tablet (0.3 mg total) under the tongue every 5 (five) minutes as needed for chest pain.  . Omega-3 Fatty Acids (FISH OIL) 1000 MG CAPS Take 1 tablet by mouth as needed.  . Tiotropium Bromide Monohydrate (SPIRIVA RESPIMAT) 2.5 MCG/ACT AERS Inhale 2.5 mcg into the lungs daily.  . Tiotropium Bromide Monohydrate (SPIRIVA RESPIMAT) 2.5 MCG/ACT AERS Inhale 2.5 mcg into the lungs daily.    Orders for this visit: No orders of the defined types were placed in this encounter.    Thank  you for the visitation and for allowing  [facility] to assist in the care of your patient. Our recommendations are noted above.  Please contact us if we can be of further service.  Vilinda Boehringer, MD [facility] Office Number: [phone]

## 2015-01-07 NOTE — Patient Instructions (Signed)
We will give you a sample of Spiriva to use daily.  We will send a prescription to you pharmacy.  Please schedule 2 months follow-up.

## 2015-01-09 DIAGNOSIS — J449 Chronic obstructive pulmonary disease, unspecified: Secondary | ICD-10-CM | POA: Insufficient documentation

## 2015-01-09 NOTE — Assessment & Plan Note (Addendum)
Mild COPD - patient with mild symptoms (DOE), no significant obstruction on PFTs, however intermittent DOE even sometimes at rest.   Plan Mild copd - start spiriva (give one month trial), she has a hx of using symbicort PRN in the past with moderate relief of symptoms, we discuss giving a Spiriva a trial, if now significant improvement will then start symbicort as maintenance therapy. - PRN albuterol  - if no significant improvement at next visit will consider repeating an ECHO (has a hx of mild to mod MR, ECHO 05/28/14)

## 2015-01-09 NOTE — Assessment & Plan Note (Signed)
Most likely due to mild COPD  Plan - cont with exercise - see plan for MILD COPD - sleep hygiene techniques discuss with patient (PSG - no apnea, maybe insomnia).

## 2015-01-10 ENCOUNTER — Other Ambulatory Visit: Payer: Self-pay | Admitting: *Deleted

## 2015-01-10 DIAGNOSIS — J301 Allergic rhinitis due to pollen: Secondary | ICD-10-CM | POA: Diagnosis not present

## 2015-01-10 DIAGNOSIS — R0600 Dyspnea, unspecified: Secondary | ICD-10-CM

## 2015-01-17 DIAGNOSIS — J301 Allergic rhinitis due to pollen: Secondary | ICD-10-CM | POA: Diagnosis not present

## 2015-01-24 DIAGNOSIS — J301 Allergic rhinitis due to pollen: Secondary | ICD-10-CM | POA: Diagnosis not present

## 2015-01-25 ENCOUNTER — Telehealth: Payer: Self-pay | Admitting: Internal Medicine

## 2015-01-25 MED ORDER — DOXYCYCLINE HYCLATE 100 MG PO TABS: 100.0000 mg | ORAL_TABLET | Freq: Two times a day (BID) | ORAL | Status: DC

## 2015-01-25 MED ORDER — BUDESONIDE-FORMOTEROL FUMARATE 80-4.5 MCG/ACT IN AERO: 2.0000 | INHALATION_SPRAY | Freq: Two times a day (BID) | RESPIRATORY_TRACT | Status: DC

## 2015-01-25 NOTE — Telephone Encounter (Signed)
Restart symbicort and stop the spiriva  Doxycyline 100 mg bid x 10 days   If not improving: Prednisone 10 mg take  4 each am x 2 days,   2 each am x 2 days,  1 each am x 2 days and stop

## 2015-01-25 NOTE — Telephone Encounter (Signed)
Called and spoke to pt. Informed pt of the recs per MW. Symbicort and Doxy sent to pharmacy. Pt verbalized understanding and denied any further questions or concerns at this time.

## 2015-01-25 NOTE — Telephone Encounter (Signed)
Called and spoke to pt. Pt c/o worsening prod cough with green mucus, wheezing, sinus pressure and congestion. Pt denies change in SOB from baseline, f/c/s or CP/tightness. VM is unavailable this afternoon. Will send to doc of day.   Dr. Melvyn Novas please advise.   Allergies  Allergen Reactions  . Aspirin Shortness Of Breath    EC Maximum Strength  . Amoxicillin-Pot Clavulanate Nausea Only and Nausea And Vomiting  . Clarithromycin Nausea And Vomiting  . Codeine Nausea And Vomiting  . Soma [Carisoprodol] Nausea And Vomiting  . Tramadol Nausea And Vomiting

## 2015-01-31 DIAGNOSIS — J301 Allergic rhinitis due to pollen: Secondary | ICD-10-CM | POA: Diagnosis not present

## 2015-02-08 DIAGNOSIS — J301 Allergic rhinitis due to pollen: Secondary | ICD-10-CM | POA: Diagnosis not present

## 2015-02-14 DIAGNOSIS — J301 Allergic rhinitis due to pollen: Secondary | ICD-10-CM | POA: Diagnosis not present

## 2015-02-21 DIAGNOSIS — J301 Allergic rhinitis due to pollen: Secondary | ICD-10-CM | POA: Diagnosis not present

## 2015-02-27 DIAGNOSIS — J301 Allergic rhinitis due to pollen: Secondary | ICD-10-CM | POA: Diagnosis not present

## 2015-03-06 ENCOUNTER — Ambulatory Visit (INDEPENDENT_AMBULATORY_CARE_PROVIDER_SITE_OTHER): Payer: Medicare Other | Admitting: Internal Medicine

## 2015-03-06 ENCOUNTER — Encounter: Payer: Self-pay | Admitting: Internal Medicine

## 2015-03-06 VITALS — BP 104/60 | HR 59 | Temp 98.2°F | Ht 60.0 in | Wt 112.2 lb

## 2015-03-06 DIAGNOSIS — J449 Chronic obstructive pulmonary disease, unspecified: Secondary | ICD-10-CM | POA: Diagnosis not present

## 2015-03-06 DIAGNOSIS — J301 Allergic rhinitis due to pollen: Secondary | ICD-10-CM | POA: Diagnosis not present

## 2015-03-06 NOTE — Assessment & Plan Note (Signed)
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    Plan Mild copd - restart spiriva respimat, 2 puff daily.  If worse clinical status will then add symbicort (80/4.5) 1 puff daily, and slowly build up to 1 puff bid. Had an unusual complaint of right knee pain last time using symbicort bid.  - PRN albuterol  - if no significant improvement at next visit will consider repeating an ECHO (has a hx of mild to mod MR, ECHO 05/28/14)

## 2015-03-06 NOTE — Progress Notes (Signed)
MRN# [id number] Natalie [name] 06-18-1946   CC: Chief Complaint  Patient presents with  . Follow-up    Pt was sick 01/25/15 MW sent abx and Symbicort asked patient to stop Spiriva. 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 She swithced back to [place] Monday and is doing we...      Brief History: HPI 10/23/2014 Patient is a pleasant 69 year old female with past medical history of esophagus spasm status post dilation on baclofen, chronic sinusitis on fluticasone,seen in consultation for intermittent episodes of shortness of breath with exertion for the past 3-4 months. History per review of records and the patient. Patient cannot identify any major inciting factor 3-4 months ago neck also dyspnea on exertion. Patient states that she has difficulty sometime doing her daily chores such as cleaning the kitchen and bathroom, she develops shortness of breath shortly afterwards that last for a few minutes. However, she states that she is an exercise instructor at the [employer], and does aerobic exercises for 10-12 minutes, with very little discomfort. She has a history of chronic sinusitis, and has had sinus surgery done by ENT, Dr. Tami Ribas, who also placed her on Symbicort as needed for shortness of breath. Patient states she uses Symbicort as a rescue medication and it does provide relief. She has tried albuterol in the past and develops nervousness and shaking. Patient states she has dyspnea after climbing 2 flights of stairs. She is a never smoker, but has had exposure to secondhand smoke for a number of years throughout her life. She has a family history significant for emphysema at an early age especially with her father and his siblings.  She denies any fevers, night sweats, chills, or rapid weight loss. She admits to intermittent cough, mostly at night with clear sputum, this occurs maybe 2-3 times per week. Patient also stated today that she has problems  with fatigue over this same time period. Patient states that she goes to bed about 11:30 PM but will repeat her phone, be on the computer, watch TV, and will not actually fall asleep until around 1:30 AM. She states that she gets 6-7 hours of sleep per night. Patient has a history of esophageal sphincter spasm and esophageal strictures, has had dilation in the past and is currently on baclofen.  PLAN - dyspnea - pfts, sleep study, sleep hygiene    ROV 01/07/15 Patient states that she is doing fairly well today, still with intermittent episodes of dyspnea (mostly with bending forward). Since her last visit she has had a 6MWT, PFTs and sleep study, for which she would like to discuss the results today. 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  Has a hx of secondhand exposure (previous marriage, and work environment). Patient states that she is going to sleep earlier now (1200am, prior 1.30am), no computer or playing games at night prior to bed; patient is still reading a book prior to bed. States that she is sleeping about 7.5hrs per night. Still using symbicort (about 1-2 times per week as needed).  Plan - spiriva trial   Events since last clinic visit: Since last visit had a minor episode of bronchitis, had doxycyline and symbicort started. She stopped symbicort last week due to knee right knee pain and restarted Spiriva. Currently, on spriva and doing well. 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   No wheezing at this time  PMHX:   Past Medical History  Diagnosis Date  . Allergy   . Nasal polyps     followed by Dr Tami Ribas  . Chest pain    Surgical Hx:  Past Surgical History  Procedure Laterality Date  . Vaginal delivery      x 3  . Abdominal hysterectomy  1984    due to fibroids  . Appendectomy  1952  .  Tonsillectomy  1950  . Nasal sinus surgery  1988, 2008  . Hemorroidectomy  1995   Family Hx:  Family History  Problem Relation Age of Onset  . Heart disease Mother   . COPD Father   . Heart disease Brother   . COPD Brother   . Cancer Daughter     Ovary  . Heart disease Maternal Grandmother   . Heart disease Maternal Grandfather   . Cancer Paternal Grandmother     Ovary - 63's and again in 64's  . COPD Paternal Grandfather    Social Hx:   History  Substance Use Topics  . Smoking status: Never Smoker   . Smokeless tobacco: Never Used  . Alcohol Use: No   Medication:   Current Outpatient Rx  Name  Route  Sig  Dispense  Refill  . aspirin EC 81 MG tablet   Oral   Take 81 mg by mouth daily.         . baclofen (LIORESAL) 10 MG tablet   Oral   Take 10 mg by mouth daily.          . budesonide-formoterol (SYMBICORT) 80-4.5 MCG/ACT inhaler   Inhalation   Inhale 2 puffs into the lungs 2 (two) times daily. Patient not taking: Reported on 03/06/2015   1 Inhaler   12   . Cholecalciferol (VITAMIN D-3) 1000 UNITS CAPS   Oral   Take 1,000 each by mouth daily.         . Coenzyme Q10 (CO Q-10) 100 MG CAPS   Oral   Take 100 mg by mouth as needed.         Marland Kitchen EPIPEN 2-PAK 0.3 MG/0.3ML SOAJ injection   Subcutaneous   Inject 0.3 mLs into the skin as needed.      1   . estradiol (ESTRACE) 0.5 MG tablet      TAKE 1 TABLET BY MOUTH EVERY DAY   30 tablet   5   . fexofenadine (ALLEGRA) 180 MG tablet   Oral   Take 180 mg by mouth daily.         . nitroGLYCERIN (NITROSTAT) 0.3 MG SL tablet   Sublingual   Place 1 tablet (0.3 mg total) under the tongue every 5 (five) minutes as needed for chest pain.   90 tablet   3   . Omega-3 Fatty Acids (FISH OIL) 1000 MG CAPS   Oral   Take 1 tablet by mouth as needed.         . Tiotropium Bromide Monohydrate (SPIRIVA RESPIMAT) 2.5 MCG/ACT AERS   Inhalation   Inhale 2.5 mcg into the lungs daily.   4 g   6      Review  of Systems: Gen:  Denies  fever, sweats, chills HEENT: Denies blurred vision, double vision, ear pain, eye pain, hearing loss, nose bleeds, sore throat Cvc:  No dizziness, chest pain or heaviness Resp:   Dyspnea with intense exertion Gi: Denies swallowing difficulty, stomach pain, nausea or vomiting, diarrhea,  constipation, bowel incontinence Gu:  Denies bladder incontinence, burning urine Ext:   No Joint pain, stiffness or swelling Skin: No skin rash, easy bruising or bleeding or hives Endoc:  No polyuria, polydipsia , polyphagia or weight change Psych: No depression, insomnia or hallucinations  Other:  All other systems negative  Allergies:  Aspirin; Amoxicillin-pot clavulanate; Clarithromycin; Codeine; Soma; and Tramadol  Physical Examination:  VS: BP 104/60 mmHg  Pulse 59  Temp(Src) 98.2 F (36.8 C) (Oral)  Ht 5' (1.524 m)  Wt 112 lb 3.2 oz (50.894 kg)  BMI 21.91 kg/m2  SpO2 95%  General Appearance: No distress  HEENT: PERRLA, EOM intact, no ptosis, no other lesions noticed Pulmonary:Exam: normal breath sounds., diaphragmatic excursion normal.No wheezing, No rales   Cardiovascular:@ Exam:  Normal S1,S2.  No m/r/g.     Abdomen:Exam: Benign, Soft, non-tender, No masses  Skin:   warm, no rashes, no ecchymosis  Extremities: normal, no cyanosis, clubbing, no edema, warm with normal capillary refill.   Labs results:  BMP Lab Results  Component Value Date   NA 142 10/01/2014   K 4.6 10/01/2014   CL 104 10/01/2014   CO2 24 10/01/2014   GLUCOSE 96 10/01/2014   BUN 11 10/01/2014   CREATININE 0.6 10/01/2014     CBC CBC Latest Ref Rng 10/01/2014 05/24/2014 11/07/2013  WBC 4.0 - 10.5 K/uL 9.5 7.0 6.1  Hemoglobin 12.0 - 15.0 g/dL 12.4 12.5 12.7  Hematocrit 36.0 - 46.0 % 37.7 37.7 37.4  Platelets 150.0 - 400.0 K/uL 402.0(H) 416.0(H) 400.0     Rad results: none available.      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    Plan Mild copd - restart spiriva respimat, 2 puff daily.  If worse clinical status will then add symbicort (80/4.5) 1 puff daily, and slowly build up to 1 puff bid. Had an unusual complaint of right knee pain last time using symbicort bid.  - PRN albuterol  - if no significant improvement at next visit will consider repeating an ECHO (has a hx of mild to mod MR, ECHO 05/28/14)     might need to consider sleep study, OSA screening at next visit.  Updated Medication List Outpatient Encounter Prescriptions as of 03/06/2015  Medication Sig  . aspirin EC 81 MG tablet Take 81 mg by mouth daily.  . baclofen (LIORESAL) 10 MG tablet Take 10 mg by mouth daily.   . budesonide-formoterol (SYMBICORT) 80-4.5 MCG/ACT inhaler Inhale 2 puffs into the lungs 2 (two) times daily. (Patient not taking: Reported on 03/06/2015)  . Cholecalciferol (VITAMIN D-3) 1000 UNITS CAPS Take 1,000 each by mouth daily.  . Coenzyme Q10 (CO Q-10) 100 MG CAPS Take 100 mg by mouth as needed.  Marland Kitchen EPIPEN 2-PAK 0.3 MG/0.3ML SOAJ injection Inject 0.3 mLs into the skin as needed.  Marland Kitchen estradiol (ESTRACE) 0.5 MG tablet TAKE 1 TABLET BY MOUTH EVERY DAY  . fexofenadine (ALLEGRA) 180 MG tablet Take 180 mg by mouth daily.  . nitroGLYCERIN (NITROSTAT) 0.3 MG SL tablet Place 1 tablet (0.3 mg total) under the tongue every 5 (five) minutes as needed for chest pain.  . Omega-3 Fatty Acids (FISH OIL) 1000 MG CAPS Take 1 tablet by mouth as needed.  . Tiotropium Bromide  Monohydrate (SPIRIVA RESPIMAT) 2.5 MCG/ACT AERS Inhale 2.5 mcg into the lungs daily.  . [DISCONTINUED] budesonide-formoterol (SYMBICORT) 80-4.5 MCG/ACT inhaler Inhale 1 puff into the lungs 2 (two) times daily as needed.   . [DISCONTINUED] doxycycline (VIBRA-TABS) 100 MG  tablet Take 1 tablet (100 mg total) by mouth 2 (two) times daily. (Patient not taking: Reported on 03/06/2015)  . [DISCONTINUED] fluticasone (FLONASE) 50 MCG/ACT nasal spray Place 2 sprays into the nose daily.  . [DISCONTINUED] ipratropium (ATROVENT) 0.06 % nasal spray Place 1 spray into both nostrils daily.  . [DISCONTINUED] montelukast (SINGULAIR) 10 MG tablet Take 10 mg by mouth daily.  . [DISCONTINUED] Tiotropium Bromide Monohydrate (SPIRIVA RESPIMAT) 2.5 MCG/ACT AERS Inhale 2.5 mcg into the lungs daily. (Patient not taking: Reported on 03/06/2015)    Orders for this visit: No orders of the defined types were placed in this encounter.    Thank  you for the visitation and for allowing  [facility] to assist in the care of your patient. Our recommendations are noted above.  Please contact us if we can be of further service.  Vilinda Boehringer, MD [facility] Office Number: [phone]

## 2015-03-06 NOTE — Patient Instructions (Addendum)
Follow up with Dr. Stevenson Clinch in 3 months.  - Sprivia Respimat 2 puff daily - continue with exercise - if you continue to have breathing issues we will consider adding back Symbicort (80/4.5) - 1 puff daily

## 2015-03-07 DIAGNOSIS — J324 Chronic pansinusitis: Secondary | ICD-10-CM | POA: Diagnosis not present

## 2015-03-07 DIAGNOSIS — J309 Allergic rhinitis, unspecified: Secondary | ICD-10-CM | POA: Diagnosis not present

## 2015-03-07 DIAGNOSIS — J339 Nasal polyp, unspecified: Secondary | ICD-10-CM | POA: Diagnosis not present

## 2015-03-12 ENCOUNTER — Telehealth: Payer: Self-pay | Admitting: *Deleted

## 2015-03-12 NOTE — Telephone Encounter (Signed)
Called patient to set up smw/pft on 4/20 or 4/21. She then needs appt after May 17 to see Dr. Stevenson Clinch.

## 2015-03-19 ENCOUNTER — Other Ambulatory Visit: Payer: Self-pay | Admitting: Internal Medicine

## 2015-03-21 DIAGNOSIS — J301 Allergic rhinitis due to pollen: Secondary | ICD-10-CM | POA: Diagnosis not present

## 2015-03-25 ENCOUNTER — Other Ambulatory Visit: Payer: Self-pay | Admitting: Internal Medicine

## 2015-03-27 ENCOUNTER — Telehealth: Payer: Self-pay | Admitting: *Deleted

## 2015-03-27 DIAGNOSIS — J301 Allergic rhinitis due to pollen: Secondary | ICD-10-CM | POA: Diagnosis not present

## 2015-03-27 NOTE — Telephone Encounter (Signed)
Nothing further needed 

## 2015-03-27 NOTE — Progress Notes (Signed)
Pt has already had PFT.

## 2015-04-01 DIAGNOSIS — K224 Dyskinesia of esophagus: Secondary | ICD-10-CM | POA: Diagnosis not present

## 2015-04-01 DIAGNOSIS — R1310 Dysphagia, unspecified: Secondary | ICD-10-CM | POA: Diagnosis not present

## 2015-04-04 DIAGNOSIS — J301 Allergic rhinitis due to pollen: Secondary | ICD-10-CM | POA: Diagnosis not present

## 2015-04-09 ENCOUNTER — Ambulatory Visit
Admit: 2015-04-09 | Disposition: A | Discharge status: Home / Self Care | Payer: Self-pay | Attending: Gastroenterology | Admitting: Gastroenterology

## 2015-04-09 DIAGNOSIS — R1310 Dysphagia, unspecified: Secondary | ICD-10-CM | POA: Diagnosis not present

## 2015-04-10 DIAGNOSIS — J301 Allergic rhinitis due to pollen: Secondary | ICD-10-CM | POA: Diagnosis not present

## 2015-04-15 DIAGNOSIS — H2513 Age-related nuclear cataract, bilateral: Secondary | ICD-10-CM | POA: Diagnosis not present

## 2015-04-18 DIAGNOSIS — J301 Allergic rhinitis due to pollen: Secondary | ICD-10-CM | POA: Diagnosis not present

## 2015-04-25 DIAGNOSIS — J301 Allergic rhinitis due to pollen: Secondary | ICD-10-CM | POA: Diagnosis not present

## 2015-05-03 DIAGNOSIS — J301 Allergic rhinitis due to pollen: Secondary | ICD-10-CM | POA: Diagnosis not present

## 2015-05-03 NOTE — Telephone Encounter (Signed)
Closing this encounter since it is from 2013 I am sure this has been handled.

## 2015-05-16 DIAGNOSIS — J301 Allergic rhinitis due to pollen: Secondary | ICD-10-CM | POA: Diagnosis not present

## 2015-05-22 DIAGNOSIS — J301 Allergic rhinitis due to pollen: Secondary | ICD-10-CM | POA: Diagnosis not present

## 2015-05-30 DIAGNOSIS — J301 Allergic rhinitis due to pollen: Secondary | ICD-10-CM | POA: Diagnosis not present

## 2015-06-06 DIAGNOSIS — J301 Allergic rhinitis due to pollen: Secondary | ICD-10-CM | POA: Diagnosis not present

## 2015-06-10 ENCOUNTER — Other Ambulatory Visit: Payer: Self-pay

## 2015-06-13 DIAGNOSIS — J301 Allergic rhinitis due to pollen: Secondary | ICD-10-CM | POA: Diagnosis not present

## 2015-06-18 ENCOUNTER — Telehealth: Payer: Self-pay

## 2015-06-18 NOTE — Telephone Encounter (Signed)
LMTCB, regarding scheduling mammogram

## 2015-06-26 DIAGNOSIS — J301 Allergic rhinitis due to pollen: Secondary | ICD-10-CM | POA: Diagnosis not present

## 2015-07-03 DIAGNOSIS — J301 Allergic rhinitis due to pollen: Secondary | ICD-10-CM | POA: Diagnosis not present

## 2015-07-10 DIAGNOSIS — J301 Allergic rhinitis due to pollen: Secondary | ICD-10-CM | POA: Diagnosis not present

## 2015-07-18 DIAGNOSIS — J301 Allergic rhinitis due to pollen: Secondary | ICD-10-CM | POA: Diagnosis not present

## 2015-07-24 DIAGNOSIS — J301 Allergic rhinitis due to pollen: Secondary | ICD-10-CM | POA: Diagnosis not present

## 2015-08-01 DIAGNOSIS — J301 Allergic rhinitis due to pollen: Secondary | ICD-10-CM | POA: Diagnosis not present

## 2015-08-02 DIAGNOSIS — J301 Allergic rhinitis due to pollen: Secondary | ICD-10-CM | POA: Diagnosis not present

## 2015-08-07 DIAGNOSIS — J301 Allergic rhinitis due to pollen: Secondary | ICD-10-CM | POA: Diagnosis not present

## 2015-08-14 DIAGNOSIS — J301 Allergic rhinitis due to pollen: Secondary | ICD-10-CM | POA: Diagnosis not present

## 2015-08-22 DIAGNOSIS — J301 Allergic rhinitis due to pollen: Secondary | ICD-10-CM | POA: Diagnosis not present

## 2015-08-29 DIAGNOSIS — J301 Allergic rhinitis due to pollen: Secondary | ICD-10-CM | POA: Diagnosis not present

## 2015-09-04 DIAGNOSIS — J301 Allergic rhinitis due to pollen: Secondary | ICD-10-CM | POA: Diagnosis not present

## 2015-09-05 DIAGNOSIS — J309 Allergic rhinitis, unspecified: Secondary | ICD-10-CM | POA: Diagnosis not present

## 2015-09-05 DIAGNOSIS — J339 Nasal polyp, unspecified: Secondary | ICD-10-CM | POA: Diagnosis not present

## 2015-09-19 DIAGNOSIS — J301 Allergic rhinitis due to pollen: Secondary | ICD-10-CM | POA: Diagnosis not present

## 2015-09-23 ENCOUNTER — Other Ambulatory Visit: Payer: Self-pay | Admitting: Internal Medicine

## 2015-09-26 DIAGNOSIS — J301 Allergic rhinitis due to pollen: Secondary | ICD-10-CM | POA: Diagnosis not present

## 2015-10-03 DIAGNOSIS — J301 Allergic rhinitis due to pollen: Secondary | ICD-10-CM | POA: Diagnosis not present

## 2015-10-10 DIAGNOSIS — J301 Allergic rhinitis due to pollen: Secondary | ICD-10-CM | POA: Diagnosis not present

## 2015-10-15 DIAGNOSIS — R1310 Dysphagia, unspecified: Secondary | ICD-10-CM | POA: Diagnosis not present

## 2015-10-15 DIAGNOSIS — R198 Other specified symptoms and signs involving the digestive system and abdomen: Secondary | ICD-10-CM | POA: Diagnosis not present

## 2015-10-15 DIAGNOSIS — K6289 Other specified diseases of anus and rectum: Secondary | ICD-10-CM | POA: Diagnosis not present

## 2015-10-15 DIAGNOSIS — R0789 Other chest pain: Secondary | ICD-10-CM | POA: Diagnosis not present

## 2015-10-21 ENCOUNTER — Other Ambulatory Visit: Payer: Self-pay | Admitting: Internal Medicine

## 2015-10-22 DIAGNOSIS — J301 Allergic rhinitis due to pollen: Secondary | ICD-10-CM | POA: Diagnosis not present

## 2015-10-22 IMAGING — RF DG BARIUM SWALLOW
5 series · 14 of 24 positions shown · non-contrast
Comparison: None.

CLINICAL DATA: 68-year-old female with dysphagia. Initial
encounter.

EXAM:
ESOPHOGRAM / BARIUM SWALLOW / BARIUM TABLET STUDY
TECHNIQUE: Combined double contrast and single contrast examination performed
using effervescent crystals, thick barium liquid, and thin barium
liquid. The patient was observed with fluoroscopy swallowing a 13 mm
barium sulphate tablet.
FLUOROSCOPY TIME:  Radiation Exposure Index (as provided by the
fluoroscopic device): 417.78 micro Gy cm2

[Series 1: cp_standard · 0.27mm/px · 8 of 29 slices shown]
[im 1/29]
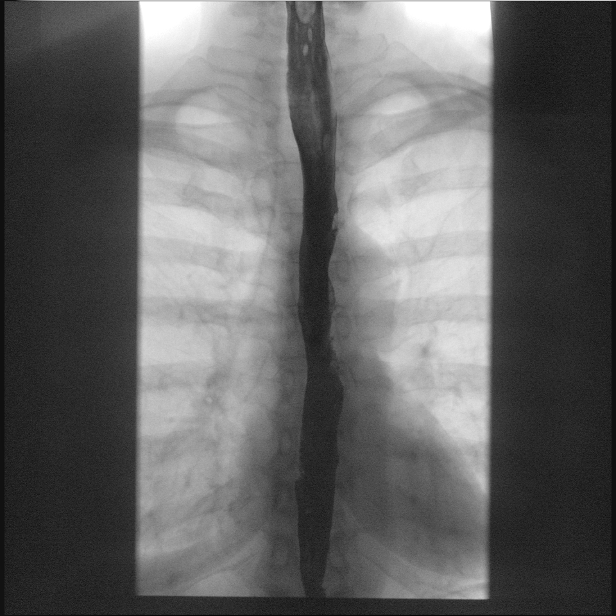
[im 5/29]
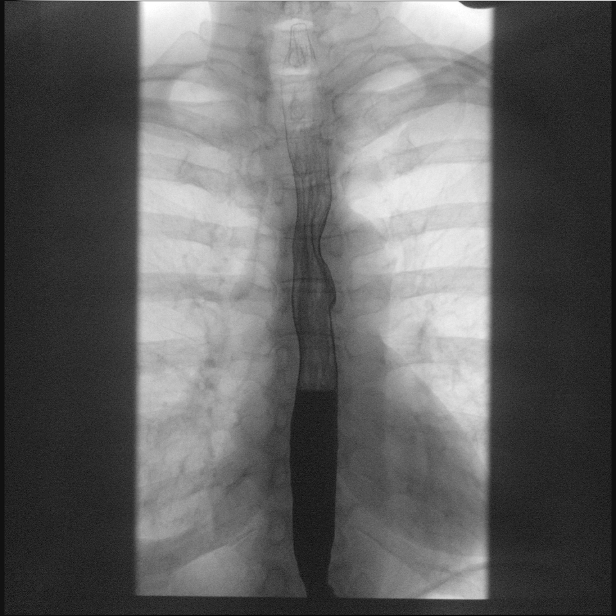
[im 9/29]
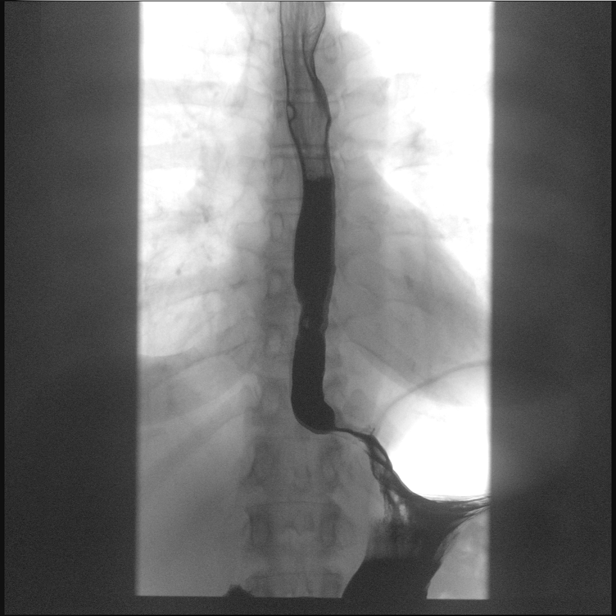
[im 14/29]
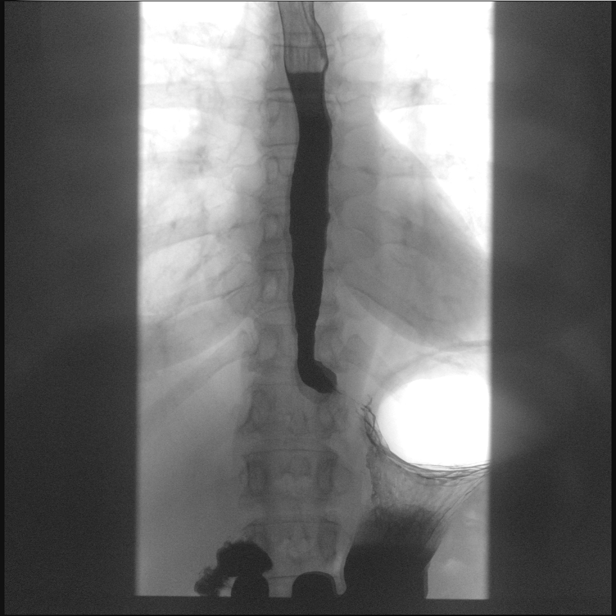
[im 17/29]
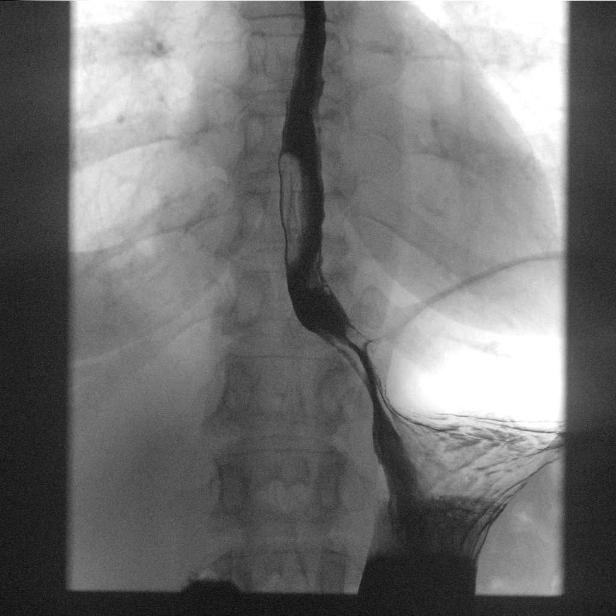
[im 21/29]
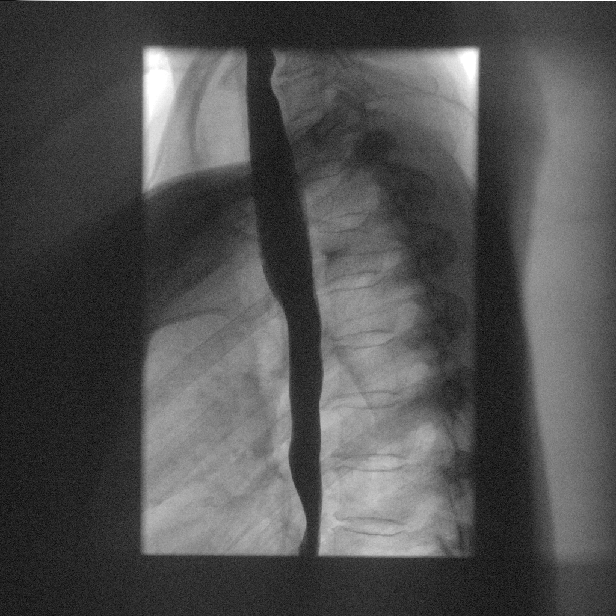
[im 26/29]
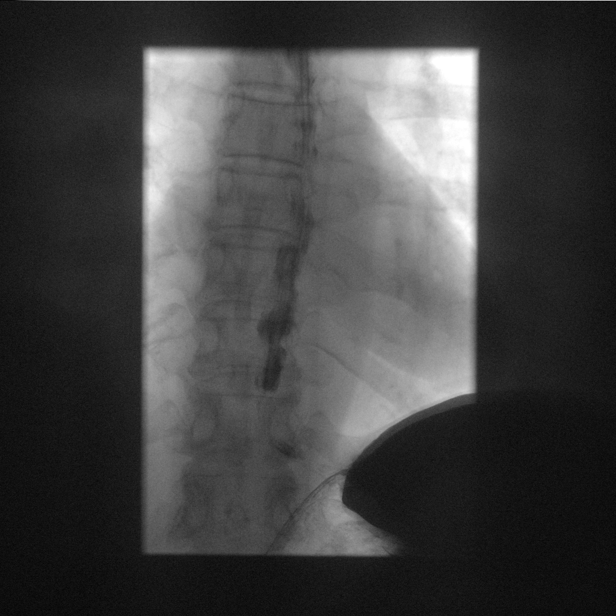
[im 27/29]
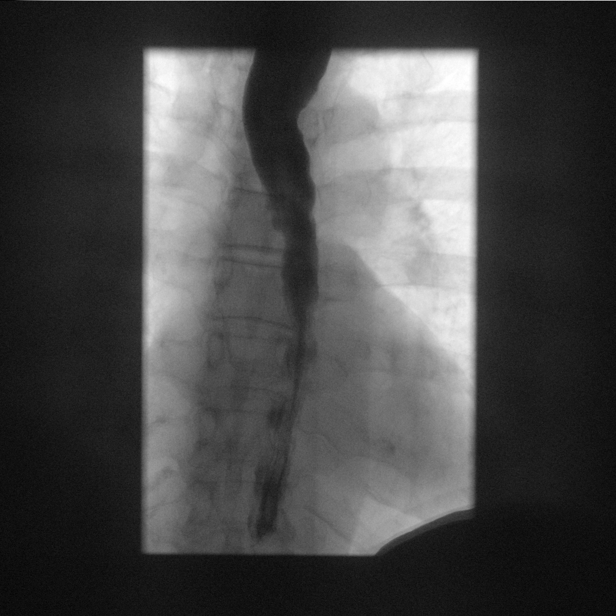

[Series 20: fluoro_barium 2fps_bw · 0.18mm/px · 1 of 5 frames shown (1 of 4)]
[frame 3/5]
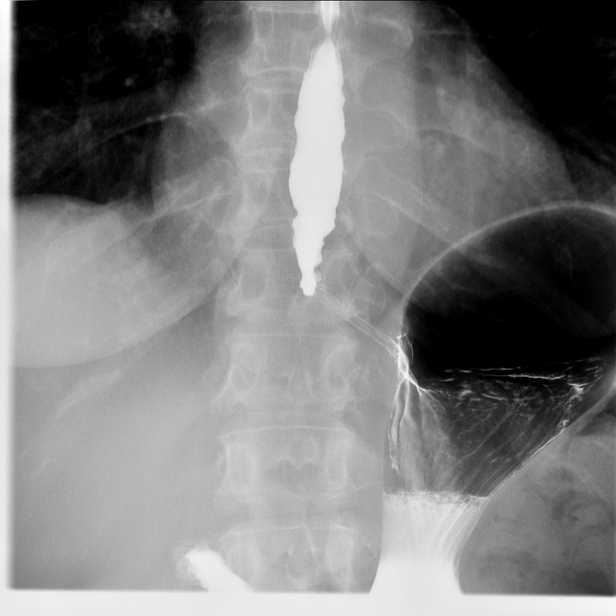

[Series 21: fluoro_barium 2fps_bw · 0.18mm/px · 2 of 11 frames shown (2 of 4)]
[frame 1/11]
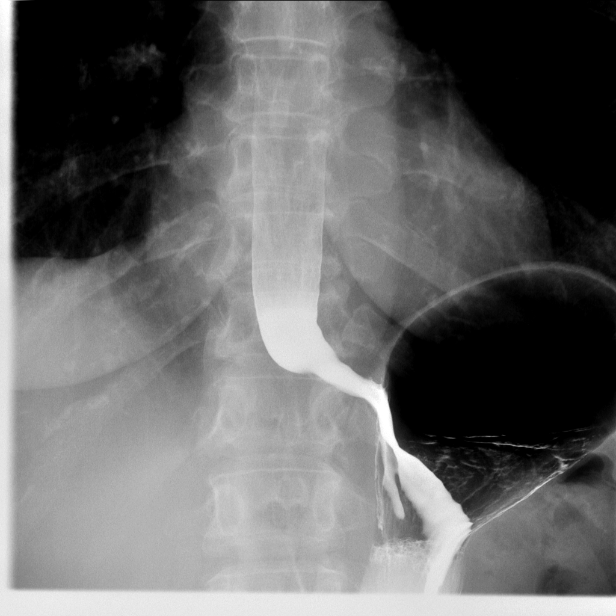
[frame 10/11]
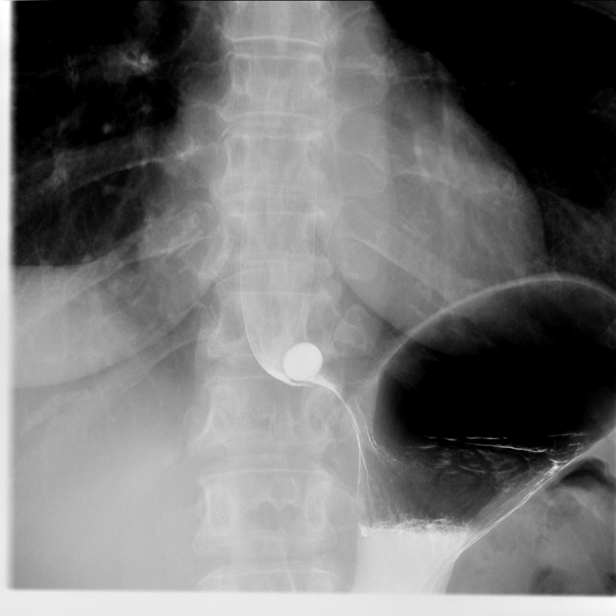

[Series 28: fluoro_barium 2fps_bw · 0.19mm/px · 1 of 9 frames shown (3 of 4)]
[frame 5/9]
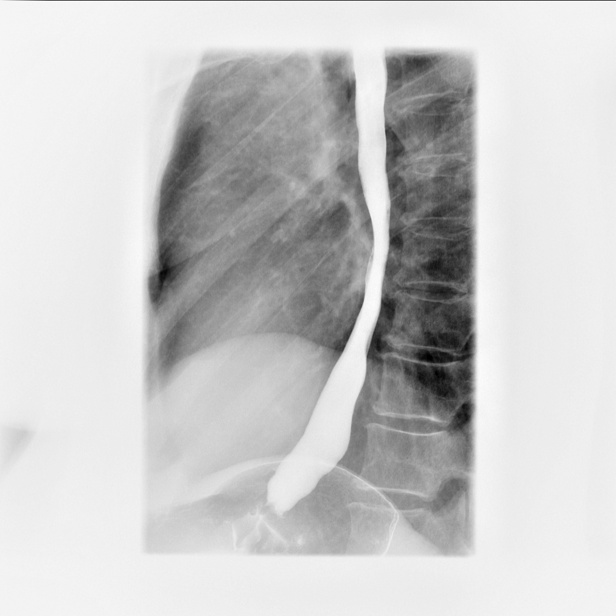

[Series 33: fluoro_barium 2fps_bw · 0.19mm/px · 2 of 14 frames shown (4 of 4)]
[frame 3/14]
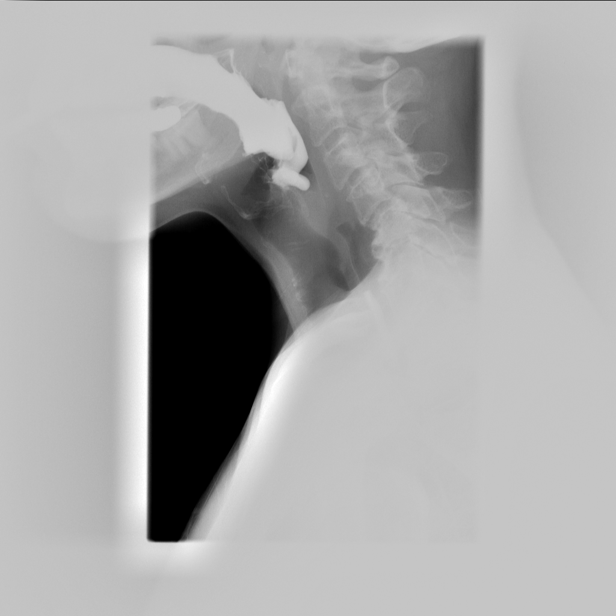
[frame 13/14]
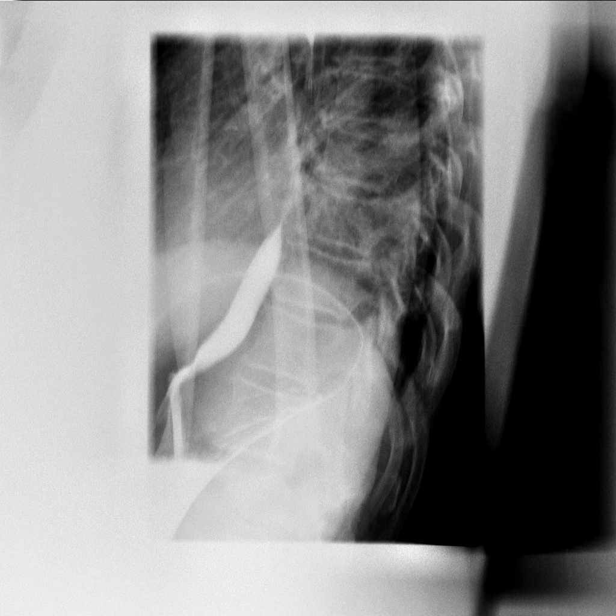

[14 of 24 positions shown; findings below may reference images not displayed]

FINDINGS: No laryngeal penetration or aspiration.

Tiny diverticulum posterior aspect upper cervical esophagus.

Mild impression upon the posterior aspect of the cervical esophagus
by cervical spondylotic changes at the C5-6 and C6-7 level.

Blunted primary esophageal stripping wave without esophageal
obstructing lesion. Tertiary wave contractions noted throughout the
exam.

Reflux to level of the upper thoracic esophagus occurs with change
of patient position. No esophageal mucosal abnormality detected.
Endoscopic surveillance may be considered given the degree of
reflux.

When the patient ingested a 13 mm barium tablet, this becomes
temporarily lodged at the distal esophagus where there is mild
smooth narrowing.
IMPRESSION: Poor esophageal motility as noted above.

Mild smooth narrowing distal esophagus where 13 mm barium tablet
becomes temporarily lodged.

Prominent reflux occurred reaching the level of the upper thoracic
esophagus. No discrete mucosal abnormality noted.

Please see above discussion.

## 2015-10-22 NOTE — Telephone Encounter (Signed)
Last OV was 11/13/2014, Please advise refill?

## 2015-10-24 DIAGNOSIS — J301 Allergic rhinitis due to pollen: Secondary | ICD-10-CM | POA: Diagnosis not present

## 2015-10-30 DIAGNOSIS — J301 Allergic rhinitis due to pollen: Secondary | ICD-10-CM | POA: Diagnosis not present

## 2015-11-06 DIAGNOSIS — J301 Allergic rhinitis due to pollen: Secondary | ICD-10-CM | POA: Diagnosis not present

## 2015-11-14 DIAGNOSIS — J301 Allergic rhinitis due to pollen: Secondary | ICD-10-CM | POA: Diagnosis not present

## 2015-11-20 DIAGNOSIS — J301 Allergic rhinitis due to pollen: Secondary | ICD-10-CM | POA: Diagnosis not present

## 2015-11-24 ENCOUNTER — Other Ambulatory Visit: Payer: Self-pay | Admitting: Internal Medicine

## 2015-11-27 DIAGNOSIS — J301 Allergic rhinitis due to pollen: Secondary | ICD-10-CM | POA: Diagnosis not present

## 2015-12-04 DIAGNOSIS — J301 Allergic rhinitis due to pollen: Secondary | ICD-10-CM | POA: Diagnosis not present

## 2015-12-11 DIAGNOSIS — J301 Allergic rhinitis due to pollen: Secondary | ICD-10-CM | POA: Diagnosis not present

## 2015-12-18 DIAGNOSIS — J301 Allergic rhinitis due to pollen: Secondary | ICD-10-CM | POA: Diagnosis not present

## 2015-12-24 ENCOUNTER — Other Ambulatory Visit: Payer: Self-pay | Admitting: Internal Medicine

## 2015-12-24 NOTE — Telephone Encounter (Signed)
Please advise? Patient from what i can see was seen last year. Refilled in December with no refills.

## 2015-12-26 DIAGNOSIS — J301 Allergic rhinitis due to pollen: Secondary | ICD-10-CM | POA: Diagnosis not present

## 2016-01-02 DIAGNOSIS — J301 Allergic rhinitis due to pollen: Secondary | ICD-10-CM | POA: Diagnosis not present

## 2016-01-09 DIAGNOSIS — J301 Allergic rhinitis due to pollen: Secondary | ICD-10-CM | POA: Diagnosis not present

## 2016-01-13 DIAGNOSIS — H2513 Age-related nuclear cataract, bilateral: Secondary | ICD-10-CM | POA: Diagnosis not present

## 2016-01-14 ENCOUNTER — Other Ambulatory Visit: Payer: Self-pay | Admitting: Internal Medicine

## 2016-01-16 DIAGNOSIS — J301 Allergic rhinitis due to pollen: Secondary | ICD-10-CM | POA: Diagnosis not present

## 2016-01-17 DIAGNOSIS — J301 Allergic rhinitis due to pollen: Secondary | ICD-10-CM | POA: Diagnosis not present

## 2016-01-21 ENCOUNTER — Other Ambulatory Visit: Payer: Self-pay

## 2016-01-21 ENCOUNTER — Ambulatory Visit (INDEPENDENT_AMBULATORY_CARE_PROVIDER_SITE_OTHER): Payer: Medicare Other

## 2016-01-21 VITALS — BP 110/62 | HR 70 | Temp 98.1°F | Resp 12 | Ht 59.25 in | Wt 115.0 lb

## 2016-01-21 DIAGNOSIS — Z23 Encounter for immunization: Secondary | ICD-10-CM | POA: Diagnosis not present

## 2016-01-21 DIAGNOSIS — Z0000 Encounter for general adult medical examination without abnormal findings: Secondary | ICD-10-CM | POA: Diagnosis not present

## 2016-01-21 DIAGNOSIS — E2839 Other primary ovarian failure: Secondary | ICD-10-CM

## 2016-01-21 DIAGNOSIS — Z1239 Encounter for other screening for malignant neoplasm of breast: Secondary | ICD-10-CM | POA: Diagnosis not present

## 2016-01-21 NOTE — Progress Notes (Signed)
Subjective:   Natalie [name] is a 70 y.o. female who presents for Medicare Annual (Subsequent) preventive examination.  Review of Systems:  No ROS.  Medicare Wellness Visit.  Cardiac Risk Factors include: advanced age (>80men, >34 women)     Objective:     Vitals: BP 110/62 mmHg  Pulse 70  Temp(Src) 98.1 F (36.7 C) (Oral)  Resp 12  Ht 4' 11.25" (1.505 m)  Wt 115 lb (52.164 kg)  BMI 23.03 kg/m2  SpO2 98%  LMP   Tobacco History  Smoking status  . Never Smoker   Smokeless tobacco  . Never Used     Counseling given: Not Answered   Past Medical History  Diagnosis Date  . Allergy   . Nasal polyps     followed by Dr Tami Ribas  . Chest pain    Past Surgical History  Procedure Laterality Date  . Vaginal delivery      x 3  . Abdominal hysterectomy  1984    due to fibroids  . Appendectomy  1952  . Tonsillectomy  1950  . Nasal sinus surgery  1988, 2008  . Hemorroidectomy  1995   Family History  Problem Relation Age of Onset  . Heart disease Mother   . COPD Father   . Heart disease Brother   . COPD Brother   . Cancer Daughter     Ovary  . Heart disease Maternal Grandmother   . Heart disease Maternal Grandfather   . Cancer Paternal Grandmother     Ovary - 30's and again in 6's  . COPD Paternal Grandfather    History  Sexual Activity  . Sexual Activity: Not Currently    Outpatient Encounter Prescriptions as of 01/21/2016  Medication Sig  . aspirin EC 81 MG tablet Take 81 mg by mouth daily.  . baclofen (LIORESAL) 10 MG tablet Take 10 mg by mouth daily.   . budesonide-formoterol (SYMBICORT) 80-4.5 MCG/ACT inhaler Inhale 2 puffs into the lungs 2 (two) times daily.  . Cholecalciferol (VITAMIN D-3) 1000 UNITS CAPS Take 1,000 each by mouth daily.  . Coenzyme Q10 (CO Q-10) 100 MG CAPS Take 100 mg by mouth as needed.  Marland Kitchen EPIPEN 2-PAK 0.3 MG/0.3ML SOAJ injection Inject 0.3 mLs into the skin as needed.  Marland Kitchen estradiol (ESTRACE) 0.5 MG tablet TAKE 1 TABLET  BY MOUTH EVERY DAY  . fexofenadine (ALLEGRA) 180 MG tablet Take 180 mg by mouth daily.  . nitroGLYCERIN (NITROSTAT) 0.3 MG SL tablet Place 1 tablet (0.3 mg total) under the tongue every 5 (five) minutes as needed for chest pain.  . Omega-3 Fatty Acids (FISH OIL) 1000 MG CAPS Take 1 tablet by mouth as needed.  . [DISCONTINUED] Tiotropium Bromide Monohydrate (SPIRIVA RESPIMAT) 2.5 MCG/ACT AERS Inhale 2.5 mcg into the lungs daily.   No facility-administered encounter medications on file as of 01/21/2016.    Activities of Daily Living In your present state of health, do you have any difficulty performing the following activities: 01/21/2016  Hearing? N  Vision? N  Difficulty concentrating or making decisions? N  Walking or climbing stairs? N  Dressing or bathing? N  Doing errands, shopping? N  Preparing Food and eating ? N  Using the Toilet? N  In the past six months, have you accidently leaked urine? N  Do you have problems with loss of bowel control? N  Managing your Medications? N  Managing your Finances? N  Housekeeping or managing your Housekeeping? N    Patient Care Team:  Jackolyn Confer, MD as PCP - General (Internal Medicine) Beverly Gust, MD (Otolaryngology)    Assessment:   This is a routine wellness examination for [facility]. The goal of the wellness visit is to assist the patient how to close the gaps in care and create a preventative care plan for the patient.   VIT D Calcium as appropriate/Osteoporosis risk reviewed.   Medications reviewed; taking without issues or barriers.  Safety issues reviewed; smoke detectors in the home. Firearms locked in a secure area in the home. Wears seatbelts when driving or riding with others. No violence in the home.  No identified risk were noted; The patient was oriented x 3; appropriate in dress and manner and no objective failures at ADL's or IADL's.   Hepatitis C Screening postponed for a future lab, per patient  request.  Chronic obstructive-stable and followed by Dr. Stevenson Clinch  Orders placed:  DEXA Scan, Mammogram, Pneumococcal 23  Patient Concerns:  Intermittent R side abdominal pain; C/O hardened stomach with constipation, taking Benefiber, onset a couple of weeks ago.  Appointment is scheduled with GI, per her.  Intermittent R hip pain; requests to follow up with PCP after DEXA Scan is completed.    Exercise Activities and Dietary recommendations Current Exercise Habits:: Structured exercise class (Teaches aerobics class 3 times weekly and attends [employer] classes 2 days a week), Time (Minutes): 60, Frequency (Times/Week): 5, Weekly Exercise (Minutes/Week): 300, Intensity: Intense  Goals    . Healthy Lifestyle     Stay hydrated! Drink plenty of fluids. Maintain exercise regiment, as tolerated with recent hip pain. Choose lean meats (chicken, [place], fish), fruits and vegetables.      Fall Risk Fall Risk  01/21/2016 11/07/2013 11/08/2012  Falls in the past year? No No No   Depression Screen PHQ 2/9 Scores 01/21/2016 11/07/2013 11/08/2012  PHQ - 2 Score 0 0 0     Cognitive Testing MMSE - Mini Mental State Exam 01/21/2016  Orientation to time 5  Orientation to Place 5  Registration 3  Attention/ Calculation 5  Recall 3  Language- name 2 objects 2  Language- repeat 1  Language- follow 3 step command 3  Language- read & follow direction 1  Write a sentence 1  Copy design 1  Total score 30    Immunization History  Administered Date(s) Administered  . Influenza Split 12/02/2009, 11/08/2012  . Influenza,inj,Quad PF,36+ Mos 11/07/2013, 10/01/2014  . Pneumococcal Conjugate-13 12/14/2009  . Pneumococcal Polysaccharide-23 01/21/2016  . Td 12/14/2006  . Zoster 12/14/2010   Screening Tests Health Maintenance  Topic Date Due  . Hepatitis C Screening  03/15/1946  . DEXA SCAN  05/25/2010  . MAMMOGRAM  04/27/2014  . INFLUENZA VACCINE  03/13/2016 (Originally 07/15/2015)  .  TETANUS/TDAP  12/14/2016  . COLONOSCOPY  12/28/2020  . ZOSTAVAX  Completed  . PNA vac Low Risk Adult  Completed      Plan:   End of life planning; Advanced aging; Advanced directives. Educational material provided on how to start the conversation with her family. HCPOA/Living Will short forms discussed.  Total time spent 20 minutes.  Encouraged to follow up with PCP as needed.  During the course of the visit the patient was educated and counseled about the following appropriate screening and preventive services:   Vaccines to include Pneumoccal, Influenza, Hepatitis B, Td, Zostavax, HCV  Electrocardiogram  Cardiovascular Disease  Colorectal cancer screening  Bone density screening  Diabetes screening  Glaucoma screening  Mammography/PAP  Nutrition counseling  Patient Instructions (the written plan) was given to the patient.   Varney Biles, LPN  [id number]

## 2016-01-21 NOTE — Patient Instructions (Addendum)
Natalie [name],  Thank you for taking time to come for your Medicare Wellness Visit.  I appreciate your ongoing commitment to your health goals. Please review the following plan we discussed and let me know if I can assist you in the future.  Bone Densitometry Bone densitometry is an imaging test that uses a special X-ray to measure the amount of calcium and other minerals in your bones (bone density). This test is also known as a bone mineral density test or dual-energy X-ray absorptiometry (DXA). The test can measure bone density at your hip and your spine. It is similar to having a regular X-ray. You may have this test to:  Diagnose a condition that causes weak or thin bones (osteoporosis).  Predict your risk of a broken bone (fracture).  Determine how well osteoporosis treatment is working. LET [facility] CARE PROVIDER KNOW ABOUT:  Any allergies you have.  All medicines you are taking, including vitamins, herbs, eye drops, creams, and over-the-counter medicines.  Previous problems you or members of your family have had with the use of anesthetics.  Any blood disorders you have.  Previous surgeries you have had.  Medical conditions you have.  Possibility of pregnancy.  Any other medical test you had within the previous 14 days that used contrast material. RISKS AND COMPLICATIONS Generally, this is a safe procedure. However, problems can occur and may include the following:  This test exposes you to a very small amount of radiation.  The risks of radiation exposure may be greater to unborn children. BEFORE THE PROCEDURE  Do not take any calcium supplements for 24 hours before having the test. You can otherwise eat and drink what you usually do.  Take off all metal jewelry, eyeglasses, dental appliances, and any other metal objects. PROCEDURE  You may lie on an exam table. There will be an X-ray generator below you and an imaging device above you.  Other devices, such as  boxes or braces, may be used to position your body properly for the scan.  You will need to lie still while the machine slowly scans your body.  The images will show up on a computer monitor. AFTER THE PROCEDURE You may need more testing at a later time.   This information is not intended to replace advice given to you by your health care provider. Make sure you discuss any questions you have with your health care provider.   Document Released: 12/22/2004 Document Revised: 12/21/2014 Document Reviewed: 05/10/2014 Elsevier Interactive Patient Education 2016 [place] A mammogram is an X-ray of the breasts that is done to check for abnormal changes. This procedure can screen for and detect any changes that may suggest breast cancer. A mammogram can also identify other changes and variations in the breast, such as:  Inflammation of the breast tissue (mastitis).  An infected area that contains a collection of pus (abscess).  A fluid-filled sac (cyst).  Fibrocystic changes. This is when breast tissue becomes denser, which can make the tissue feel rope-like or uneven under the skin.  Tumors that are not cancerous (benign). LET [facility] CARE PROVIDER KNOW ABOUT:  Any allergies you have.  If you have breast implants.  If you have had previous breast disease, biopsy, or surgery.  If you are breastfeeding.  Any possibility that you could be pregnant, if this applies.  If you are younger than age 7.  If you have a family history of breast cancer. RISKS AND COMPLICATIONS Generally, this is  a safe procedure. However, problems may occur, including:  Exposure to radiation. Radiation levels are very low with this test.  The results being misinterpreted.  The need for further tests.  The inability of the mammogram to detect certain cancers. BEFORE THE PROCEDURE  Schedule your test about 1-2 weeks after your menstrual period. This is usually when your breasts are  the least tender.  If you have had a mammogram done at a different facility in the past, get the mammogram X-rays or have them sent to your current exam facility in order to compare them.  Wash your breasts and under your arms the day of the test.  Do not wear deodorants, perfumes, lotions, or powders anywhere on your body on the day of the test.  Remove any jewelry from your neck.  Wear clothes that you can change into and out of easily. PROCEDURE  You will undress from the waist up and put on a gown.  You will stand in front of the X-ray machine.  Each breast will be placed between two plastic or glass plates. The plates will compress your breast for a few seconds. Try to stay as relaxed as possible during the procedure. This does not cause any harm to your breasts and any discomfort you feel will be very brief.  X-rays will be taken from different angles of each breast. The procedure may vary among health care providers and hospitals. AFTER THE PROCEDURE  The mammogram will be examined by a specialist (radiologist).  You may need to repeat certain parts of the test, depending on the quality of the images. This is commonly done if the radiologist needs a better view of the breast tissue.  Ask when your test results will be ready. Make sure you get your test results.  You may resume your normal activities.   This information is not intended to replace advice given to you by your health care provider. Make sure you discuss any questions you have with your health care provider.   Document Released: 11/27/2000 Document Revised: 08/21/2015 Document Reviewed: 02/08/2015 Elsevier Interactive Patient Education [employer].

## 2016-01-21 NOTE — Progress Notes (Signed)
Annual Wellness Visit as completed by Health Coach was reviewed in full.  

## 2016-01-23 ENCOUNTER — Other Ambulatory Visit: Payer: Medicare Other

## 2016-01-23 ENCOUNTER — Ambulatory Visit: Payer: Medicare Other

## 2016-01-24 ENCOUNTER — Other Ambulatory Visit: Payer: Self-pay | Admitting: Internal Medicine

## 2016-01-30 DIAGNOSIS — J301 Allergic rhinitis due to pollen: Secondary | ICD-10-CM | POA: Diagnosis not present

## 2016-02-04 ENCOUNTER — Other Ambulatory Visit: Payer: Self-pay | Admitting: Internal Medicine

## 2016-02-04 ENCOUNTER — Ambulatory Visit
Admission: RE | Admit: 2016-02-04 | Discharge: 2016-02-04 | Disposition: A | Discharge status: Home / Self Care | Payer: Medicare Other | Source: Ambulatory Visit | Attending: Internal Medicine | Admitting: Internal Medicine

## 2016-02-04 DIAGNOSIS — M8580 Other specified disorders of bone density and structure, unspecified site: Secondary | ICD-10-CM | POA: Insufficient documentation

## 2016-02-04 DIAGNOSIS — Z1239 Encounter for other screening for malignant neoplasm of breast: Secondary | ICD-10-CM

## 2016-02-04 DIAGNOSIS — Z1231 Encounter for screening mammogram for malignant neoplasm of breast: Secondary | ICD-10-CM

## 2016-02-04 DIAGNOSIS — Z13820 Encounter for screening for osteoporosis: Secondary | ICD-10-CM | POA: Diagnosis not present

## 2016-02-04 DIAGNOSIS — E2839 Other primary ovarian failure: Secondary | ICD-10-CM

## 2016-02-04 DIAGNOSIS — M85852 Other specified disorders of bone density and structure, left thigh: Secondary | ICD-10-CM | POA: Diagnosis not present

## 2016-02-06 ENCOUNTER — Encounter: Payer: Self-pay | Admitting: *Deleted

## 2016-02-06 DIAGNOSIS — J301 Allergic rhinitis due to pollen: Secondary | ICD-10-CM | POA: Diagnosis not present

## 2016-02-12 DIAGNOSIS — H25813 Combined forms of age-related cataract, bilateral: Secondary | ICD-10-CM | POA: Diagnosis not present

## 2016-02-13 DIAGNOSIS — J301 Allergic rhinitis due to pollen: Secondary | ICD-10-CM | POA: Diagnosis not present

## 2016-02-17 DIAGNOSIS — J0100 Acute maxillary sinusitis, unspecified: Secondary | ICD-10-CM | POA: Diagnosis not present

## 2016-02-17 DIAGNOSIS — R51 Headache: Secondary | ICD-10-CM | POA: Diagnosis not present

## 2016-02-19 DIAGNOSIS — J301 Allergic rhinitis due to pollen: Secondary | ICD-10-CM | POA: Diagnosis not present

## 2016-02-20 ENCOUNTER — Other Ambulatory Visit: Payer: Self-pay | Admitting: Internal Medicine

## 2016-02-20 NOTE — Telephone Encounter (Signed)
Refilled 01/2016 for 30-days and no refills. Please advise?

## 2016-02-26 DIAGNOSIS — J301 Allergic rhinitis due to pollen: Secondary | ICD-10-CM | POA: Diagnosis not present

## 2016-03-03 ENCOUNTER — Encounter: Payer: Self-pay | Admitting: Internal Medicine

## 2016-03-03 ENCOUNTER — Ambulatory Visit (INDEPENDENT_AMBULATORY_CARE_PROVIDER_SITE_OTHER): Payer: Medicare Other | Admitting: Internal Medicine

## 2016-03-03 VITALS — BP 121/77 | HR 67 | Temp 97.7°F | Ht 59.0 in | Wt 115.4 lb

## 2016-03-03 DIAGNOSIS — E785 Hyperlipidemia, unspecified: Secondary | ICD-10-CM | POA: Diagnosis not present

## 2016-03-03 DIAGNOSIS — M8580 Other specified disorders of bone density and structure, unspecified site: Secondary | ICD-10-CM | POA: Diagnosis not present

## 2016-03-03 DIAGNOSIS — Z780 Asymptomatic menopausal state: Secondary | ICD-10-CM | POA: Diagnosis not present

## 2016-03-03 DIAGNOSIS — Z01818 Encounter for other preprocedural examination: Secondary | ICD-10-CM | POA: Diagnosis not present

## 2016-03-03 MED ORDER — ESTRADIOL 0.5 MG PO TABS: 0.5000 mg | ORAL_TABLET | Freq: Every day | ORAL | Status: DC

## 2016-03-03 MED ORDER — ESTRADIOL 0.1 MG/GM VA CREA: 1.0000 | TOPICAL_CREAM | Freq: Every day | VAGINAL | Status: DC

## 2016-03-03 NOTE — Assessment & Plan Note (Signed)
Continue Estrace oral alternated with topical preparation. She understands the potential risks and benefits of this medication.

## 2016-03-03 NOTE — Assessment & Plan Note (Signed)
Reviewed bone density testing and scoring system with pt. Recommended adequate dietary calcium, supplemental Vit D 2000units daily and continued physical activity. Will check Vit D level with labs today.

## 2016-03-03 NOTE — Progress Notes (Signed)
Subjective:    Patient ID: Natalie [name], female    DOB: Jul 25, 1946, 70 y.o.   MRN: [id number]  HPI  70YO female presents for follow up.  Bone density - T-score -1.8. FRAX risk of major fracture 10.5% and hip fracture 1.9% over next 10 years. This was stable from exam in 2013.  She continues to exercise on a regular basis. She is feeling well. No concerns today.  Wt Readings from Last 3 Encounters:  03/03/16 115 lb 6 oz (52.334 kg)  01/21/16 115 lb (52.164 kg)  03/06/15 112 lb 3.2 oz (50.894 kg)   BP Readings from Last 3 Encounters:  03/03/16 121/77  01/21/16 110/62  03/06/15 104/60    Past Medical History  Diagnosis Date  . Allergy   . Nasal polyps     followed by Dr Tami Ribas  . Chest pain    Family History  Problem Relation Age of Onset  . Heart disease Mother   . COPD Father   . Heart disease Brother   . COPD Brother   . Cancer Daughter     Ovary  . Heart disease Maternal Grandmother   . Heart disease Maternal Grandfather   . Cancer Paternal Grandmother     Ovary - 36's and again in 6's  . COPD Paternal Grandfather   . Breast cancer Neg Hx    Past Surgical History  Procedure Laterality Date  . Vaginal delivery      x 3  . Abdominal hysterectomy  1984    due to fibroids  . Appendectomy  1952  . Tonsillectomy  1950  . Nasal sinus surgery  1988, 2008  . Hemorroidectomy  1995   Social History   Social History  . Marital Status: Married    Spouse Name: N/A  . Number of Children: 3  . Years of Education: N/A   Social History Main Topics  . Smoking status: Never Smoker   . Smokeless tobacco: Never Used  . Alcohol Use: No  . Drug Use: No  . Sexual Activity: Not Currently   Other Topics Concern  . None   Social History Narrative    Review of Systems  Constitutional: Negative for fever, chills, appetite change, fatigue and unexpected weight change.  Eyes: Negative for visual disturbance.  Respiratory: Negative for shortness of  breath.   Cardiovascular: Negative for chest pain and leg swelling.  Gastrointestinal: Negative for nausea, vomiting, abdominal pain, diarrhea and constipation.  Musculoskeletal: Negative for myalgias and arthralgias.  Skin: Negative for color change and rash.  Hematological: Negative for adenopathy. Does not bruise/bleed easily.  Psychiatric/Behavioral: Negative for sleep disturbance and dysphoric mood. The patient is not nervous/anxious.        Objective:    BP 121/77 mmHg  Pulse 67  Temp(Src) 97.7 F (36.5 C) (Oral)  Ht 4\' 11"  (1.499 m)  Wt 115 lb 6 oz (52.334 kg)  BMI 23.29 kg/m2  SpO2 99% Physical Exam  Constitutional: She is oriented to person, place, and time. She appears well-developed and well-nourished. No distress.  HENT:  Head: Normocephalic and atraumatic.  Right Ear: External ear normal.  Left Ear: External ear normal.  Nose: Nose normal.  Mouth/Throat: Oropharynx is clear and moist. No oropharyngeal exudate.  Eyes: Conjunctivae are normal. Pupils are equal, round, and reactive to light. Right eye exhibits no discharge. Left eye exhibits no discharge. No scleral icterus.  Neck: Normal range of motion. Neck supple. No tracheal deviation present. No thyromegaly present.  Cardiovascular:  Normal rate, regular rhythm, normal heart sounds and intact distal pulses.  Exam reveals no gallop and no friction rub.   No murmur heard. Pulmonary/Chest: Effort normal and breath sounds normal. No respiratory distress. She has no wheezes. She has no rales. She exhibits no tenderness.  Musculoskeletal: Normal range of motion. She exhibits no edema or tenderness.  Lymphadenopathy:    She has no cervical adenopathy.  Neurological: She is alert and oriented to person, place, and time. No cranial nerve deficit. She exhibits normal muscle tone. Coordination normal.  Skin: Skin is warm and dry. No rash noted. She is not diaphoretic. No erythema. No pallor.  Psychiatric: She has a normal  mood and affect. Her behavior is normal. Judgment and thought content normal.          Assessment & Plan:  Over 25min of which >50% spent in face-to-face contact with patient discussing plan of care  Problem List Items Addressed This Visit      Unprioritized   Osteopenia - Primary    Reviewed bone density testing and scoring system with pt. Recommended adequate dietary calcium, supplemental Vit D 2000units daily and continued physical activity. Will check Vit D level with labs today.      Relevant Orders   VITAMIN D 25 Hydroxy (Vit-D Deficiency, Fractures)   Postmenopausal estrogen deficiency    Continue Estrace oral alternated with topical preparation. She understands the potential risks and benefits of this medication.       Other Visit Diagnoses    Hyperlipidemia        Relevant Orders    Comprehensive metabolic panel    Lipid panel    Preop examination        Relevant Orders    CBC with Differential/Platelet        Return in about 6 months (around 09/03/2016) for Recheck.  Ronette Deter, MD Internal Medicine [facility]

## 2016-03-03 NOTE — Patient Instructions (Signed)
Fasting labs this week.  Follow up in 6 months.

## 2016-03-03 NOTE — Progress Notes (Signed)
Pre visit review using our clinic review tool, if applicable. No additional management support is needed unless otherwise documented below in the visit note. 

## 2016-03-04 DIAGNOSIS — J301 Allergic rhinitis due to pollen: Secondary | ICD-10-CM | POA: Diagnosis not present

## 2016-03-05 ENCOUNTER — Other Ambulatory Visit: Payer: Medicare Other

## 2016-03-10 ENCOUNTER — Other Ambulatory Visit (INDEPENDENT_AMBULATORY_CARE_PROVIDER_SITE_OTHER): Payer: Medicare Other

## 2016-03-10 DIAGNOSIS — M8580 Other specified disorders of bone density and structure, unspecified site: Secondary | ICD-10-CM | POA: Diagnosis not present

## 2016-03-10 DIAGNOSIS — Z01818 Encounter for other preprocedural examination: Secondary | ICD-10-CM | POA: Diagnosis not present

## 2016-03-10 DIAGNOSIS — E785 Hyperlipidemia, unspecified: Secondary | ICD-10-CM

## 2016-03-10 DIAGNOSIS — J324 Chronic pansinusitis: Secondary | ICD-10-CM | POA: Diagnosis not present

## 2016-03-10 DIAGNOSIS — H6981 Other specified disorders of Eustachian tube, right ear: Secondary | ICD-10-CM | POA: Diagnosis not present

## 2016-03-10 DIAGNOSIS — H9011 Conductive hearing loss, unilateral, right ear, with unrestricted hearing on the contralateral side: Secondary | ICD-10-CM | POA: Diagnosis not present

## 2016-03-10 LAB — LIPID PANEL
CHOLESTEROL: 214 mg/dL — AB (ref 0–200)
HDL: 81.8 mg/dL (ref >39.00)
LDL Cholesterol: 118 mg/dL — ABNORMAL HIGH (ref 0–99)
NonHDL: 132.39
Total CHOL/HDL Ratio: 3
Triglycerides: 70 mg/dL (ref 0.0–149.0)
VLDL: 14 mg/dL (ref 0.0–40.0)

## 2016-03-10 LAB — VITAMIN D 25 HYDROXY (VIT D DEFICIENCY, FRACTURES): VITD: 20.07 ng/mL — ABNORMAL LOW (ref 30.00–100.00)

## 2016-03-10 LAB — COMPREHENSIVE METABOLIC PANEL
ALK PHOS: 52 U/L (ref 39–117)
ALT: 17 U/L (ref 0–35)
AST: 20 U/L (ref 0–37)
Albumin: 3.7 g/dL (ref 3.5–5.2)
BUN: 13 mg/dL (ref 6–23)
CHLORIDE: 106 meq/L (ref 96–112)
CO2: 29 mEq/L (ref 19–32)
CREATININE: 0.67 mg/dL (ref 0.40–1.20)
Calcium: 9.2 mg/dL (ref 8.4–10.5)
GFR: 92.27 mL/min (ref >60.00)
Glucose, Bld: 92 mg/dL (ref 70–99)
POTASSIUM: 4.6 meq/L (ref 3.5–5.1)
SODIUM: 141 meq/L (ref 135–145)
Total Bilirubin: 0.6 mg/dL (ref 0.2–1.2)
Total Protein: 6.3 g/dL (ref 6.0–8.3)

## 2016-03-10 LAB — CBC WITH DIFFERENTIAL/PLATELET
BASOS PCT: 0.6 % (ref 0.0–3.0)
Basophils Absolute: 0 10*3/uL (ref 0.0–0.1)
EOS ABS: 0.2 10*3/uL (ref 0.0–0.7)
EOS PCT: 2.6 % (ref 0.0–5.0)
HCT: 37.7 % (ref 36.0–46.0)
Hemoglobin: 12.6 g/dL (ref 12.0–15.0)
LYMPHS ABS: 1.9 10*3/uL (ref 0.7–4.0)
Lymphocytes Relative: 27.7 % (ref 12.0–46.0)
MCHC: 33.4 g/dL (ref 30.0–36.0)
MCV: 90.6 fl (ref 78.0–100.0)
Monocytes Absolute: 0.6 10*3/uL (ref 0.1–1.0)
Monocytes Relative: 8.2 % (ref 3.0–12.0)
NEUTROS PCT: 60.9 % (ref 43.0–77.0)
Neutro Abs: 4.2 10*3/uL (ref 1.4–7.7)
Platelets: 320 10*3/uL (ref 150.0–400.0)
RBC: 4.16 Mil/uL (ref 3.87–5.11)
RDW: 13.2 % (ref 11.5–15.5)
WBC: 6.9 10*3/uL (ref 4.0–10.5)

## 2016-03-12 DIAGNOSIS — H2511 Age-related nuclear cataract, right eye: Secondary | ICD-10-CM | POA: Diagnosis not present

## 2016-03-16 DIAGNOSIS — H2511 Age-related nuclear cataract, right eye: Secondary | ICD-10-CM | POA: Diagnosis not present

## 2016-03-18 DIAGNOSIS — J301 Allergic rhinitis due to pollen: Secondary | ICD-10-CM | POA: Diagnosis not present

## 2016-03-19 ENCOUNTER — Other Ambulatory Visit: Payer: Self-pay | Admitting: Internal Medicine

## 2016-03-26 DIAGNOSIS — J301 Allergic rhinitis due to pollen: Secondary | ICD-10-CM | POA: Diagnosis not present

## 2016-04-01 DIAGNOSIS — H6981 Other specified disorders of Eustachian tube, right ear: Secondary | ICD-10-CM | POA: Diagnosis not present

## 2016-04-01 DIAGNOSIS — J339 Nasal polyp, unspecified: Secondary | ICD-10-CM | POA: Diagnosis not present

## 2016-04-02 DIAGNOSIS — J301 Allergic rhinitis due to pollen: Secondary | ICD-10-CM | POA: Diagnosis not present

## 2016-04-08 DIAGNOSIS — J301 Allergic rhinitis due to pollen: Secondary | ICD-10-CM | POA: Diagnosis not present

## 2016-04-09 DIAGNOSIS — H2512 Age-related nuclear cataract, left eye: Secondary | ICD-10-CM | POA: Diagnosis not present

## 2016-04-10 DIAGNOSIS — J301 Allergic rhinitis due to pollen: Secondary | ICD-10-CM | POA: Diagnosis not present

## 2016-04-16 DIAGNOSIS — J301 Allergic rhinitis due to pollen: Secondary | ICD-10-CM | POA: Diagnosis not present

## 2016-04-22 DIAGNOSIS — H2512 Age-related nuclear cataract, left eye: Secondary | ICD-10-CM | POA: Diagnosis not present

## 2016-05-06 DIAGNOSIS — J301 Allergic rhinitis due to pollen: Secondary | ICD-10-CM | POA: Diagnosis not present

## 2016-05-21 DIAGNOSIS — J301 Allergic rhinitis due to pollen: Secondary | ICD-10-CM | POA: Diagnosis not present

## 2016-05-28 DIAGNOSIS — J301 Allergic rhinitis due to pollen: Secondary | ICD-10-CM | POA: Diagnosis not present

## 2016-06-04 DIAGNOSIS — J301 Allergic rhinitis due to pollen: Secondary | ICD-10-CM | POA: Diagnosis not present

## 2016-06-17 DIAGNOSIS — J301 Allergic rhinitis due to pollen: Secondary | ICD-10-CM | POA: Diagnosis not present

## 2016-06-25 DIAGNOSIS — J301 Allergic rhinitis due to pollen: Secondary | ICD-10-CM | POA: Diagnosis not present

## 2016-07-03 DIAGNOSIS — J301 Allergic rhinitis due to pollen: Secondary | ICD-10-CM | POA: Diagnosis not present

## 2016-07-08 DIAGNOSIS — J301 Allergic rhinitis due to pollen: Secondary | ICD-10-CM | POA: Diagnosis not present

## 2016-07-15 DIAGNOSIS — J301 Allergic rhinitis due to pollen: Secondary | ICD-10-CM | POA: Diagnosis not present

## 2016-07-22 DIAGNOSIS — J301 Allergic rhinitis due to pollen: Secondary | ICD-10-CM | POA: Diagnosis not present

## 2016-07-29 DIAGNOSIS — J301 Allergic rhinitis due to pollen: Secondary | ICD-10-CM | POA: Diagnosis not present

## 2016-08-06 DIAGNOSIS — J301 Allergic rhinitis due to pollen: Secondary | ICD-10-CM | POA: Diagnosis not present

## 2016-08-12 DIAGNOSIS — J301 Allergic rhinitis due to pollen: Secondary | ICD-10-CM | POA: Diagnosis not present

## 2016-08-19 DIAGNOSIS — J301 Allergic rhinitis due to pollen: Secondary | ICD-10-CM | POA: Diagnosis not present

## 2016-09-02 DIAGNOSIS — J301 Allergic rhinitis due to pollen: Secondary | ICD-10-CM | POA: Diagnosis not present

## 2016-09-03 ENCOUNTER — Ambulatory Visit: Payer: Medicare Other | Admitting: Internal Medicine

## 2016-09-09 DIAGNOSIS — J301 Allergic rhinitis due to pollen: Secondary | ICD-10-CM | POA: Diagnosis not present

## 2016-09-17 DIAGNOSIS — J301 Allergic rhinitis due to pollen: Secondary | ICD-10-CM | POA: Diagnosis not present

## 2016-09-21 ENCOUNTER — Telehealth: Payer: Self-pay | Admitting: Internal Medicine

## 2016-09-21 NOTE — Telephone Encounter (Signed)
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 appt scheduled and informed pt if she got worse to give [place] a call back if she needs to be seen before then. Nothing further needed at this time.

## 2016-09-21 NOTE — Telephone Encounter (Signed)
Pt c/o Shortness Of Breath: STAT if SOB developed within the last 24 hours or pt is noticeably SOB on the phone  1. Are you currently SOB (can you hear that pt is SOB on the phone)?  No   2. How long have you been experiencing SOB?  Several months   3. Are you SOB when sitting or when up moving around?  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 Are you currently experiencing any other symptoms?  Nausea

## 2016-09-23 DIAGNOSIS — J301 Allergic rhinitis due to pollen: Secondary | ICD-10-CM | POA: Diagnosis not present

## 2016-09-29 DIAGNOSIS — J301 Allergic rhinitis due to pollen: Secondary | ICD-10-CM | POA: Diagnosis not present

## 2016-10-01 DIAGNOSIS — J301 Allergic rhinitis due to pollen: Secondary | ICD-10-CM | POA: Diagnosis not present

## 2016-10-05 ENCOUNTER — Ambulatory Visit: Payer: Medicare Other | Admitting: Internal Medicine

## 2016-10-13 ENCOUNTER — Encounter: Payer: Self-pay | Admitting: Internal Medicine

## 2016-10-13 ENCOUNTER — Ambulatory Visit (INDEPENDENT_AMBULATORY_CARE_PROVIDER_SITE_OTHER): Payer: Medicare Other | Admitting: Internal Medicine

## 2016-10-13 VITALS — BP 114/76 | HR 85 | Wt 117.0 lb

## 2016-10-13 DIAGNOSIS — J449 Chronic obstructive pulmonary disease, unspecified: Secondary | ICD-10-CM | POA: Diagnosis not present

## 2016-10-13 DIAGNOSIS — R0609 Other forms of dyspnea: Secondary | ICD-10-CM

## 2016-10-13 NOTE — Assessment & Plan Note (Signed)
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    Today, we've had to stop her Symbicort given the paradoxical effect of increased shortness of breath, cough, palpitations. Patient does have a history of mild-moderate mitral regurg and had a last echocardiogram done in 2015. I believe at this time further cardiology workup may be warranted for her dyspnea on exertion.  Plan Mild copd - PRN albuterol  - Cardiology follow-up - Stop Symbicort at this time

## 2016-10-13 NOTE — Assessment & Plan Note (Addendum)
Most likely due to mild COPD however with worsening symptoms over the past 1 month, has a hx for mild to mod MR on echo. Follow with cardiology in [place]. Patient ironically had a paradoxical effect with Symbicort, especially with dyspnea on exertion. Symbicort is now stopped. I believe at this time further workup by cardiology may be warranted given her increased shortness of breath.  Plan - cont with exercise - see plan for MILD COPD -follow-up with cardiology - sleep hygiene techniques discuss with patient (PSG - no apnea, maybe insomnia).

## 2016-10-13 NOTE — Addendum Note (Signed)
Addended by: Oscar La R on: 10/13/2016 02:30 PM   Modules accepted: Orders

## 2016-10-13 NOTE — Patient Instructions (Addendum)
Follow up with Dr. Alva Garnet in 6 weeks - stop symbicort - please follow up with your cardiologist to consider either a repeat ECHO or stress test given the increase shortness of breath and the paradoxical effect of symbicort.  - cont with allergy control.  - we will send a copy of our office visit to Dr. Sallyanne Kuster (Cardiologist).  - office spirometry at follow up visit.

## 2016-10-13 NOTE — Progress Notes (Signed)
MRN# [id number] Natalie [name] 08-06-1946   CC: Chief Complaint  Patient presents with  . Follow-up    SOB w/activity: stopped Symbicort and breathing got breathing; chest tightness; dizziness;       Brief History: HPI 10/23/2014 Patient is a pleasant 70 year old female with past medical history of esophagus spasm status post dilation on baclofen, chronic sinusitis on fluticasone,seen in consultation for intermittent episodes of shortness of breath with exertion for the past 3-4 months. History per review of records and the patient. Patient cannot identify any major inciting factor 3-4 months ago neck also dyspnea on exertion. Patient states that she has difficulty sometime doing her daily chores such as cleaning the kitchen and bathroom, she develops shortness of breath shortly afterwards that last for a few minutes. However, she states that she is an exercise instructor at the [employer], and does aerobic exercises for 10-12 minutes, with very little discomfort. She has a history of chronic sinusitis, and has had sinus surgery done by ENT, Dr. Tami Ribas, who also placed her on Symbicort as needed for shortness of breath. Patient states she uses Symbicort as a rescue medication and it does provide relief. She has tried albuterol in the past and develops nervousness and shaking. Patient states she has dyspnea after climbing 2 flights of stairs. She is a never smoker, but has had exposure to secondhand smoke for a number of years throughout her life. She has a family history significant for emphysema at an early age especially with her father and his siblings.  She denies any fevers, night sweats, chills, or rapid weight loss. She admits to intermittent cough, mostly at night with clear sputum, this occurs maybe 2-3 times per week. Patient also stated today that she has problems with fatigue over this same time period. Patient states that she goes to bed about 11:30 PM but will repeat her  phone, be on the computer, watch TV, and will not actually fall asleep until around 1:30 AM. She states that she gets 6-7 hours of sleep per night. Patient has a history of esophageal sphincter spasm and esophageal strictures, has had dilation in the past and is currently on baclofen.   Events since last clinic visit: Presents today for a follow up visit. Was using symbicort, but had a paradoxial effect (dizziness and SOB) with it, stopped it about 1 month ago, and symptoms have significantly improved, but not back to baseline. 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   PMHX:   Past Medical History:  Diagnosis Date  . Allergy   . Chest pain   . Nasal polyps    followed by Dr Tami Ribas   Surgical Hx:  Past Surgical History:  Procedure Laterality Date  . ABDOMINAL HYSTERECTOMY  1984   due to fibroids  . APPENDECTOMY  1952  . HEMORROIDECTOMY  1995  . NASAL SINUS SURGERY  1988, 2008  . TONSILLECTOMY  1950  . VAGINAL DELIVERY     x 3   Family Hx:  Family History  Problem Relation Age of Onset  . Heart disease Mother   . COPD Father   . Heart disease Brother   . COPD Brother   . Cancer Daughter     Ovary  . Heart disease Maternal Grandmother   . Heart disease Maternal Grandfather   . Cancer Paternal Grandmother     Ovary - 68's and again in 9's  . COPD Paternal Grandfather   . Breast cancer Neg Hx  Social Hx:   Social History  Substance Use Topics  . Smoking status: Never Smoker  . Smokeless tobacco: Never Used  . Alcohol use No   Medication:    Current Outpatient Prescriptions:  .  aspirin EC 81 MG tablet, Take 81 mg by mouth daily., Disp: , Rfl:  .  baclofen (LIORESAL) 10 MG tablet, Take 10 mg by mouth daily. , Disp: , Rfl:  .  cyanocobalamin 1000 MCG tablet, Take 1,000 mcg by mouth daily., Disp: , Rfl:  .  EPIPEN 2-PAK 0.3 MG/0.3ML SOAJ injection, Inject 0.3 mLs into the skin as needed., Disp: , Rfl: 1 .  estradiol (ESTRACE) 0.5 MG  tablet, Take 1 tablet (0.5 mg total) by mouth daily., Disp: 30 tablet, Rfl: 11 .  fexofenadine (ALLEGRA) 180 MG tablet, Take 180 mg by mouth daily., Disp: , Rfl:  .  Omega-3 Fatty Acids (FISH OIL) 1000 MG CAPS, Take 1 tablet by mouth as needed., Disp: , Rfl:  .  Probiotic Product (DIGESTIVE ADVANTAGE PO), Take 50,000 Units by mouth at bedtime., Disp: , Rfl:  .  Probiotic Product (PROBIOTIC PO), Take 50,000 Units by mouth daily., Disp: , Rfl:     Review of Systems: Gen:  Denies  fever, sweats, chills HEENT: Denies blurred vision, double vision, ear pain, eye pain, hearing loss, nose bleeds, sore throat Cvc:  No dizziness, chest pain or heaviness Resp:   Dyspnea with intense exertion Gi: Denies swallowing difficulty, stomach pain, nausea or vomiting, diarrhea, constipation, bowel incontinence Gu:  Denies bladder incontinence, burning urine Ext:   No Joint pain, stiffness or swelling Skin: No skin rash, easy bruising or bleeding or hives Endoc:  No polyuria, polydipsia , polyphagia or weight change Psych: No depression, insomnia or hallucinations  Other:  All other systems negative  Allergies:  Aspirin; Amoxicillin-pot clavulanate; Clarithromycin; Codeine; Soma [carisoprodol]; and Tramadol  Physical Examination:  VS: BP 114/76 (BP Location: Left Arm, Cuff Size: Normal)   Pulse 85   Wt 117 lb (53.1 kg)   SpO2 100%   BMI 23.63 kg/m   General Appearance: No distress  HEENT: PERRLA, EOM intact, no ptosis, no other lesions noticed Pulmonary:Exam: normal breath sounds., diaphragmatic excursion normal.No wheezing, No rales   Cardiovascular:@ Exam:  Normal S1,S2.  No m/r/g.     Abdomen:Exam: Benign, Soft, non-tender, No masses  Skin:   warm, no rashes, no ecchymosis  Extremities: normal, no cyanosis, clubbing, no edema, warm with normal capillary refill.   Labs results:  BMP Lab Results  Component Value Date   NA 141 03/10/2016   K 4.6 03/10/2016   CL 106 03/10/2016   CO2 29  03/10/2016   GLUCOSE 92 03/10/2016   BUN 13 03/10/2016   CREATININE 0.67 03/10/2016     CBC CBC Latest Ref Rng & Units 03/10/2016 10/01/2014 05/24/2014  WBC 4.0 - 10.5 K/uL 6.9 9.5 7.0  Hemoglobin 12.0 - 15.0 g/dL 12.6 12.4 12.5  Hematocrit 36.0 - 46.0 % 37.7 37.7 37.7  Platelets 150.0 - 400.0 K/uL 320.0 402.0(H) 416.0(H)     Rad results: none available.      Assessment and Plan:70 yo with mild COPD for follow up visit, recent episode Increased shortness of breath, heart palpitations, paradoxical effect of Symbicort   Dyspnea Most likely due to mild COPD however with worsening symptoms over the past 1 month, has a hx for mild to mod MR on echo. Follow with cardiology in [place]. Patient ironically had a paradoxical effect with Symbicort, especially with  dyspnea on exertion. Symbicort is now stopped. I believe at this time further workup by cardiology may be warranted given her increased shortness of breath.  Plan - cont with exercise - see plan for MILD COPD -follow-up with cardiology - sleep hygiene techniques discuss with patient (PSG - no apnea, maybe insomnia).     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    Today, we've had to stop her Symbicort given the paradoxical effect of increased shortness of breath, cough, palpitations. Patient does have a history of mild-moderate mitral regurg and had a last echocardiogram done in 2015. I believe at this time further cardiology workup may be warranted for her dyspnea on exertion.  Plan Mild copd - PRN albuterol  - Cardiology follow-up - Stop Symbicort at this time  might need to consider sleep study, OSA screening at next visit.  Updated Medication List Outpatient Encounter Prescriptions as of 10/13/2016  Medication Sig  . aspirin EC 81 MG tablet Take 81 mg by  mouth daily.  . baclofen (LIORESAL) 10 MG tablet Take 10 mg by mouth daily.   . cyanocobalamin 1000 MCG tablet Take 1,000 mcg by mouth daily.  Marland Kitchen EPIPEN 2-PAK 0.3 MG/0.3ML SOAJ injection Inject 0.3 mLs into the skin as needed.  Marland Kitchen estradiol (ESTRACE) 0.5 MG tablet Take 1 tablet (0.5 mg total) by mouth daily.  . fexofenadine (ALLEGRA) 180 MG tablet Take 180 mg by mouth daily.  . Omega-3 Fatty Acids (FISH OIL) 1000 MG CAPS Take 1 tablet by mouth as needed.  . Probiotic Product (DIGESTIVE ADVANTAGE PO) Take 50,000 Units by mouth at bedtime.  . Probiotic Product (PROBIOTIC PO) Take 50,000 Units by mouth daily.  . [DISCONTINUED] estradiol (ESTRACE VAGINAL) 0.1 MG/GM vaginal cream Place 1 Applicatorful vaginally at bedtime.  . [DISCONTINUED] budesonide-formoterol (SYMBICORT) 80-4.5 MCG/ACT inhaler Inhale 2 puffs into the lungs 2 (two) times daily.  . [DISCONTINUED] nitroGLYCERIN (NITROSTAT) 0.3 MG SL tablet Place 1 tablet (0.3 mg total) under the tongue every 5 (five) minutes as needed for chest pain.   No facility-administered encounter medications on file as of 10/13/2016.     Orders for this visit: No orders of the defined types were placed in this encounter.   Thank  you for the visitation and for allowing  [facility] to assist in the care of your patient. Our recommendations are noted above.  Please contact us if we can be of further service.  Vilinda Boehringer, MD [facility] Office Number: [phone]

## 2016-10-15 ENCOUNTER — Ambulatory Visit: Payer: Medicare Other | Admitting: Physician Assistant

## 2016-10-20 ENCOUNTER — Ambulatory Visit (INDEPENDENT_AMBULATORY_CARE_PROVIDER_SITE_OTHER): Payer: Medicare Other | Admitting: Physician Assistant

## 2016-10-20 ENCOUNTER — Other Ambulatory Visit: Payer: Self-pay | Admitting: Physician Assistant

## 2016-10-20 ENCOUNTER — Encounter: Payer: Self-pay | Admitting: Physician Assistant

## 2016-10-20 VITALS — BP 126/78 | HR 68 | Ht 59.0 in | Wt 115.2 lb

## 2016-10-20 DIAGNOSIS — R0609 Other forms of dyspnea: Secondary | ICD-10-CM | POA: Diagnosis not present

## 2016-10-20 DIAGNOSIS — R079 Chest pain, unspecified: Secondary | ICD-10-CM | POA: Diagnosis not present

## 2016-10-20 LAB — CBC
HEMATOCRIT: 37.9 % (ref 35.0–45.0)
Hemoglobin: 12.5 g/dL (ref 11.7–15.5)
MCH: 30.9 pg (ref 27.0–33.0)
MCHC: 33 g/dL (ref 32.0–36.0)
MCV: 93.6 fL (ref 80.0–100.0)
MPV: 10.1 fL (ref 7.5–12.5)
PLATELETS: 483 10*3/uL — AB (ref 140–400)
RBC: 4.05 MIL/uL (ref 3.80–5.10)
RDW: 12.6 % (ref 11.0–15.0)
WBC: 8.9 10*3/uL (ref 3.8–10.8)

## 2016-10-20 NOTE — Patient Instructions (Addendum)
Medication Instructions:   Your physician recommends that you continue on your current medications as directed. Please refer to the Current Medication list given to you today.    If you need a refill on your cardiac medications before your next appointment, please call your pharmacy.  Labwork: CBC BMET AND PT /INR TODAY    Testing/Procedures:   1. DX DYSPNEA ON EXERTION  THIS Thursday IF POSSIBLE 10/22/16. OR NEXT AVAILABLE  ASAP DATE...  2. FOR OUTPATIENT RIGHT HEART CATH  Your physician has requested that you have a cardiac catheterization. Cardiac catheterization is used to diagnose and/or treat various heart conditions. Doctors may recommend this procedure for a number of different reasons. The most common reason is to evaluate chest pain. Chest pain can be a symptom of coronary artery disease (CAD), and cardiac catheterization can show whether plaque is narrowing or blocking your heart's arteries. This procedure is also used to evaluate the valves, as well as measure the blood flow and oxygen levels in different parts of your heart. For further information please visit [email or web address]. Please follow instruction sheet, as given.   Follow-Up:    1 TO 2 MONTHS WITH DR CROITORU POST CATH    Any Other Special Instructions Will Be Listed Below (If Applicable).

## 2016-10-20 NOTE — Progress Notes (Signed)
Cardiology Office Note   Date:  10/20/2016   ID:  Natalie [name], DOB [birth year], MRN [id number]  PCP:  Rica Mast, MD  Cardiologist:  Dr Sallyanne Kuster 2014 Rosaria Ferries, PA-C   Chief Complaint  Patient presents with  . Shortness of Breath  . Fatigue  . Dizziness    History of Present Illness: Natalie [name] is a 70 y.o. female with a history of COPD (now improved), allergies, esoph spasm w/ dilatation, sinusitis, FH CAD, no FH SCD, nl echo 2015  Seen primary MD for DOE, cards referral made  Natalie [name] presents for evaluation of DOE.   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  For the last several months, she has been feeling tired and having DOE. She would feel tired and have no energy after teaching a class. She has to quit talking during the class, has to cut back on the amount of activity she does and has to rest afterwards. She gets dyspneic>>then gets light-headed and nauseated. She cuts back on her activity level and the symptoms improve to the point that she can continue. Some days are worse than others and sx have gradually worsened in general. When she is SOB, if she pushes, she will get chest discomfort, a squeezing pain, 5/10. When she stops and the SOB gets better, the pain goes away. She has not used sublingual nitroglycerin.  She thought it was allergies at first. She had not been using the Symbicort but started back when she began getting the DOE, without improvement in symptoms. She has had no resting symptoms.  Before being started on the baclofen 2 years ago, she used to get chest tightness and had used SL NTG 3 times with improvement. However, after she was started on Baclofen bid for GI issues she had no more of the chest tightness until now.    Past Medical History:  Diagnosis Date  . Allergy   . Chest pain   . Nasal polyps    followed by Dr Tami Ribas    Past Surgical History:    Procedure Laterality Date  . ABDOMINAL HYSTERECTOMY  1984   due to fibroids  . APPENDECTOMY  1952  . HEMORROIDECTOMY  1995  . NASAL SINUS SURGERY  1988, 2008  . TONSILLECTOMY  1950  . VAGINAL DELIVERY     x 3    Current Outpatient Prescriptions  Medication Sig Dispense Refill  . aspirin EC 81 MG tablet Take 81 mg by mouth daily.    . baclofen (LIORESAL) 10 MG tablet Take 10 mg by mouth daily.     . cyanocobalamin 1000 MCG tablet Take 1,000 mcg by mouth daily.    Marland Kitchen EPIPEN 2-PAK 0.3 MG/0.3ML SOAJ injection Inject 0.3 mLs into the skin as needed.  1  . estradiol (ESTRACE) 0.5 MG tablet Take 1 tablet (0.5 mg total) by mouth daily. 30 tablet 11  . fexofenadine (ALLEGRA) 180 MG tablet Take 180 mg by mouth daily.    . Loratadine (CLARITIN PO) Take 1 tablet by mouth daily.    . Multiple Vitamins-Minerals (CENTRUM ADULTS) TABS Take 1 tablet by mouth daily.    . Omega-3 Fatty Acids (FISH OIL) 1000 MG CAPS Take 1 tablet by mouth as needed.    . Probiotic Product (DIGESTIVE ADVANTAGE PO) Take 50,000 Units by mouth 2 (two) times daily at 8 am and 10 pm.      No current facility-administered medications for this visit.  Allergies:   Aspirin; Amoxicillin-pot clavulanate; Clarithromycin; Codeine; Soma [carisoprodol]; and Tramadol    Social History:  The patient  reports that she has never smoked. She has never used smokeless tobacco. She reports that she does not drink alcohol or use drugs.   Family History:  The patient's family history includes COPD in her brother and paternal grandfather; COPD (age of onset: 43) in her father; Cancer in her daughter and paternal grandmother; Heart disease in her maternal grandfather and maternal grandmother; Heart disease (age of onset: 68) in her brother; Heart disease (age of onset: 26) in her mother; Pneumonia (age of onset: 45) in her sister.    ROS:  Please see the history of present illness. All other systems are reviewed and negative.    PHYSICAL  EXAM: VS:  BP 126/78 (BP Location: Left Arm, Patient Position: Sitting, Cuff Size: Normal)   Pulse 68   Ht 4\' 11"  (1.499 m)   Wt 115 lb 3.2 oz (52.3 kg)   SpO2 99%   BMI 23.27 kg/m  , BMI Body mass index is 23.27 kg/m. GEN: Well nourished, well developed, female in no acute distress  HEENT: normal for age  Neck: no JVD, no carotid bruit, no masses Cardiac: RRR; no murmur, no rubs, or gallops Respiratory:  clear to auscultation bilaterally, normal work of breathing GI: soft, nontender, nondistended, + BS MS: no deformity or atrophy; no edema; distal pulses are 2+ in all 4 extremities   Skin: warm and dry, no rash Neuro:  Strength and sensation are intact Psych: euthymic mood, full affect   EKG:  EKG is ordered today. The ekg ordered today demonstrates sinus rhythm, no acute ischemic changes, no Q waves indicating an old MI.  ECHO: 05/2014  EF 50-55%, nl R heart pressures,  no sig valve problems.  GXT: 08/2013 109% Pred max HR w/out ischemic changes or CP   Recent Labs: 03/10/2016: ALT 17; BUN 13; Creatinine, Ser 0.67; Hemoglobin 12.6; Platelets 320.0; Potassium 4.6; Sodium 141    Lipid Panel    Component Value Date/Time   CHOL 214 (H) 03/10/2016 1019   TRIG 70.0 03/10/2016 1019   HDL 81.80 03/10/2016 1019   CHOLHDL 3 03/10/2016 1019   VLDL 14.0 03/10/2016 1019   LDLCALC 118 (H) 03/10/2016 1019     Wt Readings from Last 3 Encounters:  10/20/16 115 lb 3.2 oz (52.3 kg)  10/13/16 117 lb (53.1 kg)  03/03/16 115 lb 6 oz (52.3 kg)     Other studies Reviewed: Additional studies/ records that were reviewed today include: Office notes and testing.  ASSESSMENT AND PLAN:  1.  Dyspnea on exertion/chest pain: She leads a very healthy life style. She teaches aerobics 5 days a week, heart healthy diet, and maintains a normal weight. She has no history of hypertension or diabetes. However, her family history is significant and in multiple family members. If she had a stress  test and it was normal, and she continued to have symptoms, I am not sure that I would feel comfortable that the stress test was accurate.  Therefore, cardiac catheterization is the only appropriate test. We will do a right and left heart cath to make sure that pressures are appropriate. The risks and benefits of a cardiac catheterization including, but not limited to, death, stroke, MI, kidney damage and bleeding were discussed with the patient who indicates understanding and agrees to proceed. We will schedule this for later this week and get labs performed today.  She  is encouraged to not push too hard and try to limit her symptoms until test. She has had no resting symptoms, and the symptoms resolve in a few minutes by decreasing her activity, so I do not feel admission is needed.  Current medicines are reviewed at length with the patient today.  The patient does not have concerns regarding medicines.  The following changes have been made:  no change  Labs/ tests ordered today include:   Orders Placed This Encounter  Procedures  . CBC  . Basic metabolic panel  . Protime-INR     Disposition:   FU with Dr. Sallyanne Kuster  Signed, Lenoard Aden  10/20/2016 4:33 PM    [place] Phone: [phone]; Fax: [phone]  This note was written with the assistance of speech recognition software. Please excuse any transcriptional errors.

## 2016-10-21 ENCOUNTER — Ambulatory Visit
Admission: RE | Admit: 2016-10-21 | Discharge: 2016-10-21 | Disposition: A | Discharge status: Home / Self Care | Payer: Medicare Other | Source: Ambulatory Visit | Attending: Physician Assistant | Admitting: Physician Assistant

## 2016-10-21 ENCOUNTER — Telehealth: Payer: Self-pay | Admitting: Physician Assistant

## 2016-10-21 ENCOUNTER — Encounter: Payer: Self-pay | Admitting: Physician Assistant

## 2016-10-21 DIAGNOSIS — R0602 Shortness of breath: Secondary | ICD-10-CM | POA: Diagnosis not present

## 2016-10-21 DIAGNOSIS — Z01810 Encounter for preprocedural cardiovascular examination: Secondary | ICD-10-CM

## 2016-10-21 LAB — BASIC METABOLIC PANEL
BUN: 10 mg/dL (ref 7–25)
CO2: 26 mmol/L (ref 20–31)
Calcium: 9.6 mg/dL (ref 8.6–10.4)
Chloride: 103 mmol/L (ref 98–110)
Creat: 0.6 mg/dL (ref 0.60–0.93)
Glucose, Bld: 100 mg/dL — ABNORMAL HIGH (ref 65–99)
Potassium: 5.2 mmol/L (ref 3.5–5.3)
SODIUM: 140 mmol/L (ref 135–146)

## 2016-10-21 LAB — PROTIME-INR
INR: 1
Prothrombin Time: 10.5 s (ref 9.0–11.5)

## 2016-10-21 NOTE — Telephone Encounter (Signed)
Pt calling again,does she needs an xray today for procedure tomorrow?

## 2016-10-21 NOTE — Telephone Encounter (Signed)
New message  Pt's husband is calling, asking if pt needs a chest xray prior to procedure on 11/9 w/Dr. Claiborne Billings  Please call back and confirm

## 2016-10-21 NOTE — Telephone Encounter (Signed)
Returned call. Has been 2 years since last chest X ray - will order as part of pre cath workup. Pt states she will be in [place] today and wanted to have this done at [place]. Orders entered and released.

## 2016-10-22 ENCOUNTER — Encounter (HOSPITAL_COMMUNITY)
Admission: RE | Disposition: A | Discharge status: Home / Self Care | Payer: Self-pay | Source: Ambulatory Visit | Attending: Cardiovascular Disease

## 2016-10-22 ENCOUNTER — Ambulatory Visit (HOSPITAL_COMMUNITY)
Admission: RE | Admit: 2016-10-22 | Discharge: 2016-10-22 | Disposition: A | Discharge status: Home / Self Care | Payer: Medicare Other | Source: Ambulatory Visit | Attending: Cardiovascular Disease | Admitting: Cardiovascular Disease

## 2016-10-22 DIAGNOSIS — R0789 Other chest pain: Secondary | ICD-10-CM | POA: Diagnosis not present

## 2016-10-22 DIAGNOSIS — Z7982 Long term (current) use of aspirin: Secondary | ICD-10-CM | POA: Diagnosis not present

## 2016-10-22 DIAGNOSIS — R0609 Other forms of dyspnea: Secondary | ICD-10-CM | POA: Diagnosis not present

## 2016-10-22 DIAGNOSIS — I2510 Atherosclerotic heart disease of native coronary artery without angina pectoris: Secondary | ICD-10-CM

## 2016-10-22 DIAGNOSIS — Z888 Allergy status to other drugs, medicaments and biological substances status: Secondary | ICD-10-CM | POA: Insufficient documentation

## 2016-10-22 DIAGNOSIS — Z79890 Hormone replacement therapy (postmenopausal): Secondary | ICD-10-CM | POA: Insufficient documentation

## 2016-10-22 DIAGNOSIS — J449 Chronic obstructive pulmonary disease, unspecified: Secondary | ICD-10-CM | POA: Diagnosis not present

## 2016-10-22 DIAGNOSIS — Z825 Family history of asthma and other chronic lower respiratory diseases: Secondary | ICD-10-CM | POA: Insufficient documentation

## 2016-10-22 DIAGNOSIS — Z886 Allergy status to analgesic agent status: Secondary | ICD-10-CM | POA: Diagnosis not present

## 2016-10-22 DIAGNOSIS — R079 Chest pain, unspecified: Secondary | ICD-10-CM | POA: Diagnosis present

## 2016-10-22 DIAGNOSIS — Z8249 Family history of ischemic heart disease and other diseases of the circulatory system: Secondary | ICD-10-CM | POA: Insufficient documentation

## 2016-10-22 DIAGNOSIS — Z9889 Other specified postprocedural states: Secondary | ICD-10-CM | POA: Diagnosis not present

## 2016-10-22 DIAGNOSIS — Z90710 Acquired absence of both cervix and uterus: Secondary | ICD-10-CM | POA: Diagnosis not present

## 2016-10-22 DIAGNOSIS — Z7951 Long term (current) use of inhaled steroids: Secondary | ICD-10-CM | POA: Insufficient documentation

## 2016-10-22 DIAGNOSIS — Z9109 Other allergy status, other than to drugs and biological substances: Secondary | ICD-10-CM | POA: Diagnosis not present

## 2016-10-22 DIAGNOSIS — Z881 Allergy status to other antibiotic agents status: Secondary | ICD-10-CM | POA: Diagnosis not present

## 2016-10-22 DIAGNOSIS — Z79899 Other long term (current) drug therapy: Secondary | ICD-10-CM | POA: Insufficient documentation

## 2016-10-22 DIAGNOSIS — Z885 Allergy status to narcotic agent status: Secondary | ICD-10-CM | POA: Diagnosis not present

## 2016-10-22 HISTORY — PX: CARDIAC CATHETERIZATION: SHX172

## 2016-10-22 LAB — POCT I-STAT 3, VENOUS BLOOD GAS (G3P V)
Acid-Base Excess: 1 mmol/L (ref 0.0–2.0)
Bicarbonate: 26.2 mmol/L (ref 20.0–28.0)
O2 SAT: 71 %
PCO2 VEN: 45.1 mmHg (ref 44.0–60.0)
PO2 VEN: 38 mmHg (ref 32.0–45.0)
TCO2: 28 mmol/L (ref 0–100)
pH, Ven: 7.373 (ref 7.250–7.430)

## 2016-10-22 SURGERY — RIGHT/LEFT HEART CATH AND CORONARY ANGIOGRAPHY
Anesthesia: LOCAL

## 2016-10-22 MED ORDER — SODIUM CHLORIDE 0.9 % IV SOLN: 250.0000 mL | INTRAVENOUS | Status: DC | PRN

## 2016-10-22 MED ORDER — ASPIRIN 81 MG PO CHEW
CHEWABLE_TABLET | ORAL | Status: AC
  Administered 2016-10-22: 81 mg via ORAL
  Filled 2016-10-22: qty 1

## 2016-10-22 MED ORDER — ATORVASTATIN CALCIUM 40 MG PO TABS
40.0000 mg | ORAL_TABLET | Freq: Every day | ORAL | Status: DC
  Filled 2016-10-22: qty 1

## 2016-10-22 MED ORDER — METOPROLOL TARTRATE 25 MG PO TABS: 12.5000 mg | ORAL_TABLET | Freq: Two times a day (BID) | ORAL | 12 refills | Status: DC

## 2016-10-22 MED ORDER — LIDOCAINE HCL (PF) 1 % IJ SOLN
INTRAMUSCULAR | Status: AC
  Filled 2016-10-22: qty 30

## 2016-10-22 MED ORDER — SODIUM CHLORIDE 0.9 % WEIGHT BASED INFUSION
3.0000 mL/kg/h | INTRAVENOUS | Status: DC
  Administered 2016-10-22: 3 mL/kg/h via INTRAVENOUS

## 2016-10-22 MED ORDER — MIDAZOLAM HCL 2 MG/2ML IJ SOLN
INTRAMUSCULAR | Status: AC
  Filled 2016-10-22: qty 2

## 2016-10-22 MED ORDER — SODIUM CHLORIDE 0.9 % WEIGHT BASED INFUSION: 1.0000 mL/kg/h | INTRAVENOUS | Status: DC

## 2016-10-22 MED ORDER — IOPAMIDOL (ISOVUE-370) INJECTION 76%
INTRAVENOUS | Status: AC
  Filled 2016-10-22: qty 100

## 2016-10-22 MED ORDER — MIDAZOLAM HCL 2 MG/2ML IJ SOLN
INTRAMUSCULAR | Status: DC | PRN
  Administered 2016-10-22: 2 mg via INTRAVENOUS

## 2016-10-22 MED ORDER — SODIUM CHLORIDE 0.9% FLUSH
3.0000 mL | Freq: Two times a day (BID) | INTRAVENOUS | Status: DC
Start: 2016-10-22 — End: 2016-10-22

## 2016-10-22 MED ORDER — ASPIRIN 81 MG PO CHEW
81.0000 mg | CHEWABLE_TABLET | ORAL | Status: AC
  Administered 2016-10-22: 81 mg via ORAL

## 2016-10-22 MED ORDER — METOPROLOL TARTRATE 12.5 MG HALF TABLET: 12.5000 mg | ORAL_TABLET | Freq: Two times a day (BID) | ORAL | Status: DC

## 2016-10-22 MED ORDER — NITROGLYCERIN 1 MG/10 ML FOR IR/CATH LAB
INTRA_ARTERIAL | Status: AC
  Filled 2016-10-22: qty 10

## 2016-10-22 MED ORDER — SODIUM CHLORIDE 0.9% FLUSH: 3.0000 mL | INTRAVENOUS | Status: DC | PRN

## 2016-10-22 MED ORDER — HEPARIN (PORCINE) IN NACL 2-0.9 UNIT/ML-% IJ SOLN
INTRAMUSCULAR | Status: DC | PRN
  Administered 2016-10-22: 1000 mL

## 2016-10-22 MED ORDER — SODIUM CHLORIDE 0.9% FLUSH: 3.0000 mL | Freq: Two times a day (BID) | INTRAVENOUS | Status: DC

## 2016-10-22 MED ORDER — VERAPAMIL HCL 2.5 MG/ML IV SOLN
INTRAVENOUS | Status: AC
  Filled 2016-10-22: qty 2

## 2016-10-22 MED ORDER — AMLODIPINE BESYLATE 2.5 MG PO TABS: 2.5000 mg | ORAL_TABLET | Freq: Every day | ORAL | 12 refills | Status: DC

## 2016-10-22 MED ORDER — ATORVASTATIN CALCIUM 40 MG PO TABS: 40.0000 mg | ORAL_TABLET | Freq: Every day | ORAL | 12 refills | Status: DC

## 2016-10-22 MED ORDER — FENTANYL CITRATE (PF) 100 MCG/2ML IJ SOLN
INTRAMUSCULAR | Status: DC | PRN
  Administered 2016-10-22: 25 ug via INTRAVENOUS

## 2016-10-22 MED ORDER — ACETAMINOPHEN 325 MG PO TABS: 650.0000 mg | ORAL_TABLET | ORAL | Status: DC | PRN

## 2016-10-22 MED ORDER — HEPARIN (PORCINE) IN NACL 2-0.9 UNIT/ML-% IJ SOLN
INTRAMUSCULAR | Status: AC
  Filled 2016-10-22: qty 1000

## 2016-10-22 MED ORDER — LIDOCAINE HCL (PF) 1 % IJ SOLN
INTRAMUSCULAR | Status: DC | PRN
  Administered 2016-10-22: 2 mL via INTRADERMAL
  Administered 2016-10-22: 20 mL via SUBCUTANEOUS
  Administered 2016-10-22: 2 mL via INTRADERMAL

## 2016-10-22 MED ORDER — HEPARIN SODIUM (PORCINE) 1000 UNIT/ML IJ SOLN
INTRAMUSCULAR | Status: AC
  Filled 2016-10-22: qty 1

## 2016-10-22 MED ORDER — SODIUM CHLORIDE 0.9 % WEIGHT BASED INFUSION: 3.0000 mL/kg/h | INTRAVENOUS | Status: AC

## 2016-10-22 MED ORDER — IOPAMIDOL (ISOVUE-370) INJECTION 76%
INTRAVENOUS | Status: DC | PRN
  Administered 2016-10-22: 70 mL via INTRAVENOUS

## 2016-10-22 MED ORDER — FENTANYL CITRATE (PF) 100 MCG/2ML IJ SOLN
INTRAMUSCULAR | Status: AC
  Filled 2016-10-22: qty 2

## 2016-10-22 MED ORDER — ONDANSETRON HCL 4 MG/2ML IJ SOLN: 4.0000 mg | Freq: Four times a day (QID) | INTRAMUSCULAR | Status: DC | PRN

## 2016-10-22 MED ORDER — AMLODIPINE BESYLATE 2.5 MG PO TABS: 2.5000 mg | ORAL_TABLET | Freq: Every day | ORAL | Status: DC

## 2016-10-22 SURGICAL SUPPLY — 13 items
CATH BALLN WEDGE 5F 110CM (CATHETERS) ×1 IMPLANT
CATH INFINITI 5FR MULTPACK ANG (CATHETERS) ×1 IMPLANT
CATH SITESEER 5F NTR (CATHETERS) ×1 IMPLANT
GLIDESHEATH SLEND SS 6F .021 (SHEATH) ×1 IMPLANT
GUIDEWIRE 3MM J TIP .035 145 (WIRE) ×1 IMPLANT
KIT HEART LEFT (KITS) ×2 IMPLANT
KIT HEART RIGHT NAMIC (KITS) ×2 IMPLANT
PACK CARDIAC CATHETERIZATION (CUSTOM PROCEDURE TRAY) ×2 IMPLANT
SHEATH FAST CATH BRACH 5F 5CM (SHEATH) ×1 IMPLANT
SHEATH PINNACLE 5F 10CM (SHEATH) ×1 IMPLANT
SYR MEDRAD MARK V 150ML (SYRINGE) ×2 IMPLANT
TRANSDUCER W/STOPCOCK (MISCELLANEOUS) ×3 IMPLANT
TUBING CIL FLEX 10 FLL-RA (TUBING) ×2 IMPLANT

## 2016-10-22 NOTE — Progress Notes (Signed)
Natalie [name], [place] paged and returned call, informed of new prescriptions. Reported that she would call them in to [facility] in [place] which was verified with pt.

## 2016-10-22 NOTE — Discharge Instructions (Signed)
Groin Surgical Site Care °Refer to this sheet in the next few weeks. These instructions provide you with information about caring for yourself after your procedure. Your health care provider may also give you more specific instructions. Your treatment has been planned according to current medical practices, but problems sometimes occur. Call your health care provider if you have any problems or questions after your procedure. °WHAT TO EXPECT AFTER THE PROCEDURE °After your procedure, it is typical to have the following: °· Bruising at the groin site that usually fades within 1-2 weeks. °· Blood collecting in the tissue (hematoma) that may be painful to the touch. It should usually decrease in size and tenderness within 1-2 weeks. °HOME CARE INSTRUCTIONS °· Take medicines only as directed by your health care provider. °· You may shower 24-48 hours after the procedure or as directed by your health care provider. Remove the bandage (dressing) and gently wash the site with plain soap and water. Pat the area dry with a clean towel. Do not rub the site, because this may cause bleeding. °· Do not take baths, swim, or use a hot tub until your health care provider approves. °· Check your insertion site every day for redness, swelling, or drainage. °· Do not apply powder or lotion to the site. °· Limit use of stairs to twice a day for the first 2-3 days or as directed by your health care provider. °· Do not squat for the first 2-3 days or as directed by your health care provider. °· Do not lift over 10 lb (4.5 kg) for 5 days after your procedure or as directed by your health care provider. °· Ask your health care provider when it is okay to: °¨ Return to work or school. °¨ Resume usual physical activities or sports. °¨ Resume sexual activity. °· Do not drive home if you are discharged the same day as the procedure. Have someone else drive you. °· You may drive 24 hours after the procedure unless otherwise instructed by your  health care provider. °· Do not operate machinery or power tools for 24 hours after the procedure or as directed by your health care provider. °· If your procedure was done as an outpatient procedure, which means that you went home the same day as your procedure, a responsible adult should be with you for the first 24 hours after you arrive home. °· Keep all follow-up visits as directed by your health care provider. This is important. °SEEK MEDICAL CARE IF: °· You have a fever. °· You have chills. °· You have increased bleeding from the groin site. Hold pressure on the site. °SEEK IMMEDIATE MEDICAL CARE IF: °· You have unusual pain at the groin site. °· You have redness, warmth, or swelling at the groin site. °· You have drainage (other than a small amount of blood on the dressing) from the groin site. °· The groin site is bleeding, and the bleeding does not stop after 30 minutes of holding steady pressure on the site. °· Your leg or foot becomes pale, cool, tingly, or numb. °  °This information is not intended to replace advice given to you by your health care provider. Make sure you discuss any questions you have with your health care provider. °  °Document Released: 08/03/2014 Document Reviewed: 08/03/2014 °Elsevier Interactive Patient Education ©2016 [employer] Inc[employer] ° °

## 2016-10-22 NOTE — Interval H&P Note (Signed)
Cath Lab Visit (complete for each Cath Lab visit)  Clinical Evaluation Leading to the Procedure:   ACS: No.  Non-ACS:    Anginal Classification: CCS II  Anti-ischemic medical therapy: No Therapy  Non-Invasive Test Results: No non-invasive testing performed  Prior CABG: No previous CABG      History and Physical Interval Note:  10/22/2016 7:52 AM  Natalie [name]  has presented today for surgery, with the diagnosis of DYSPNEA upon excertion  The various methods of treatment have been discussed with the patient and family. After consideration of risks, benefits and other options for treatment, the patient has consented to  Procedure(s): Right/Left Heart Cath and Coronary Angiography (N/A) as a surgical intervention .  The patient's history has been reviewed, patient examined, no change in status, stable for surgery.  I have reviewed the patient's chart and labs.  Questions were answered to the patient's satisfaction.     Shelva Majestic

## 2016-10-22 NOTE — H&P (View-Only) (Signed)
Cardiology Office Note   Date:  10/20/2016   ID:  Natalie [name], DOB 1946-01-24, MRN [id number]  PCP:  Rica Mast, MD  Cardiologist:  Dr Sallyanne Kuster 2014 Rosaria Ferries, PA-C   Chief Complaint  Patient presents with  . Shortness of Breath  . Fatigue  . Dizziness    History of Present Illness: Natalie [name] is a 70 y.o. female with a history of COPD (now improved), allergies, esoph spasm w/ dilatation, sinusitis, FH CAD, no FH SCD, nl echo 2015  Seen primary MD for DOE, cards referral made  Natalie [name] presents for evaluation of DOE.   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  For the last several months, she has been feeling tired and having DOE. She would feel tired and have no energy after teaching a class. She has to quit talking during the class, has to cut back on the amount of activity she does and has to rest afterwards. She gets dyspneic>>then gets light-headed and nauseated. She cuts back on her activity level and the symptoms improve to the point that she can continue. Some days are worse than others and sx have gradually worsened in general. When she is SOB, if she pushes, she will get chest discomfort, a squeezing pain, 5/10. When she stops and the SOB gets better, the pain goes away. She has not used sublingual nitroglycerin.  She thought it was allergies at first. She had not been using the Symbicort but started back when she began getting the DOE, without improvement in symptoms. She has had no resting symptoms.  Before being started on the baclofen 2 years ago, she used to get chest tightness and had used SL NTG 3 times with improvement. However, after she was started on Baclofen bid for GI issues she had no more of the chest tightness until now.    Past Medical History:  Diagnosis Date  . Allergy   . Chest pain   . Nasal polyps    followed by Dr Tami Ribas    Past Surgical History:    Procedure Laterality Date  . ABDOMINAL HYSTERECTOMY  1984   due to fibroids  . APPENDECTOMY  1952  . HEMORROIDECTOMY  1995  . NASAL SINUS SURGERY  1988, 2008  . TONSILLECTOMY  1950  . VAGINAL DELIVERY     x 3    Current Outpatient Prescriptions  Medication Sig Dispense Refill  . aspirin EC 81 MG tablet Take 81 mg by mouth daily.    . baclofen (LIORESAL) 10 MG tablet Take 10 mg by mouth daily.     . cyanocobalamin 1000 MCG tablet Take 1,000 mcg by mouth daily.    Marland Kitchen EPIPEN 2-PAK 0.3 MG/0.3ML SOAJ injection Inject 0.3 mLs into the skin as needed.  1  . estradiol (ESTRACE) 0.5 MG tablet Take 1 tablet (0.5 mg total) by mouth daily. 30 tablet 11  . fexofenadine (ALLEGRA) 180 MG tablet Take 180 mg by mouth daily.    . Loratadine (CLARITIN PO) Take 1 tablet by mouth daily.    . Multiple Vitamins-Minerals (CENTRUM ADULTS) TABS Take 1 tablet by mouth daily.    . Omega-3 Fatty Acids (FISH OIL) 1000 MG CAPS Take 1 tablet by mouth as needed.    . Probiotic Product (DIGESTIVE ADVANTAGE PO) Take 50,000 Units by mouth 2 (two) times daily at 8 am and 10 pm.      No current facility-administered medications for this visit.  Allergies:   Aspirin; Amoxicillin-pot clavulanate; Clarithromycin; Codeine; Soma [carisoprodol]; and Tramadol    Social History:  The patient  reports that she has never smoked. She has never used smokeless tobacco. She reports that she does not drink alcohol or use drugs.   Family History:  The patient's family history includes COPD in her brother and paternal grandfather; COPD (age of onset: 30) in her father; Cancer in her daughter and paternal grandmother; Heart disease in her maternal grandfather and maternal grandmother; Heart disease (age of onset: 13) in her brother; Heart disease (age of onset: 16) in her mother; Pneumonia (age of onset: 86) in her sister.    ROS:  Please see the history of present illness. All other systems are reviewed and negative.    PHYSICAL  EXAM: VS:  BP 126/78 (BP Location: Left Arm, Patient Position: Sitting, Cuff Size: Normal)   Pulse 68   Ht 4\' 11"  (1.499 m)   Wt 115 lb 3.2 oz (52.3 kg)   SpO2 99%   BMI 23.27 kg/m  , BMI Body mass index is 23.27 kg/m. GEN: Well nourished, well developed, female in no acute distress  HEENT: normal for age  Neck: no JVD, no carotid bruit, no masses Cardiac: RRR; no murmur, no rubs, or gallops Respiratory:  clear to auscultation bilaterally, normal work of breathing GI: soft, nontender, nondistended, + BS MS: no deformity or atrophy; no edema; distal pulses are 2+ in all 4 extremities   Skin: warm and dry, no rash Neuro:  Strength and sensation are intact Psych: euthymic mood, full affect   EKG:  EKG is ordered today. The ekg ordered today demonstrates sinus rhythm, no acute ischemic changes, no Q waves indicating an old MI.  ECHO: 05/2014  EF 50-55%, nl R heart pressures,  no sig valve problems.  GXT: 08/2013 109% Pred max HR w/out ischemic changes or CP   Recent Labs: 03/10/2016: ALT 17; BUN 13; Creatinine, Ser 0.67; Hemoglobin 12.6; Platelets 320.0; Potassium 4.6; Sodium 141    Lipid Panel    Component Value Date/Time   CHOL 214 (H) 03/10/2016 1019   TRIG 70.0 03/10/2016 1019   HDL 81.80 03/10/2016 1019   CHOLHDL 3 03/10/2016 1019   VLDL 14.0 03/10/2016 1019   LDLCALC 118 (H) 03/10/2016 1019     Wt Readings from Last 3 Encounters:  10/20/16 115 lb 3.2 oz (52.3 kg)  10/13/16 117 lb (53.1 kg)  03/03/16 115 lb 6 oz (52.3 kg)     Other studies Reviewed: Additional studies/ records that were reviewed today include: Office notes and testing.  ASSESSMENT AND PLAN:  1.  Dyspnea on exertion/chest pain: She leads a very healthy life style. She teaches aerobics 5 days a week, heart healthy diet, and maintains a normal weight. She has no history of hypertension or diabetes. However, her family history is significant and in multiple family members. If she had a stress  test and it was normal, and she continued to have symptoms, I am not sure that I would feel comfortable that the stress test was accurate.  Therefore, cardiac catheterization is the only appropriate test. We will do a right and left heart cath to make sure that pressures are appropriate. The risks and benefits of a cardiac catheterization including, but not limited to, death, stroke, MI, kidney damage and bleeding were discussed with the patient who indicates understanding and agrees to proceed. We will schedule this for later this week and get labs performed today.  She  is encouraged to not push too hard and try to limit her symptoms until test. She has had no resting symptoms, and the symptoms resolve in a few minutes by decreasing her activity, so I do not feel admission is needed.  Current medicines are reviewed at length with the patient today.  The patient does not have concerns regarding medicines.  The following changes have been made:  no change  Labs/ tests ordered today include:   Orders Placed This Encounter  Procedures  . CBC  . Basic metabolic panel  . Protime-INR     Disposition:   FU with Dr. Sallyanne Kuster  Signed, Lenoard Aden  10/20/2016 4:33 PM    [place] Phone: [phone]; Fax: [phone]  This note was written with the assistance of speech recognition software. Please excuse any transcriptional errors.

## 2016-10-23 ENCOUNTER — Encounter (HOSPITAL_COMMUNITY): Payer: Self-pay | Admitting: Cardiovascular Disease

## 2016-10-23 MED FILL — Verapamil HCl IV Soln 2.5 MG/ML: INTRAVENOUS | Qty: 2 | Status: AC

## 2016-10-29 DIAGNOSIS — J301 Allergic rhinitis due to pollen: Secondary | ICD-10-CM | POA: Diagnosis not present

## 2016-11-11 DIAGNOSIS — J301 Allergic rhinitis due to pollen: Secondary | ICD-10-CM | POA: Diagnosis not present

## 2016-11-18 DIAGNOSIS — J301 Allergic rhinitis due to pollen: Secondary | ICD-10-CM | POA: Diagnosis not present

## 2016-11-25 ENCOUNTER — Ambulatory Visit (INDEPENDENT_AMBULATORY_CARE_PROVIDER_SITE_OTHER): Payer: Medicare Other | Admitting: Physician Assistant

## 2016-11-25 ENCOUNTER — Encounter: Payer: Self-pay | Admitting: Physician Assistant

## 2016-11-25 VITALS — BP 124/54 | HR 63 | Ht 59.0 in | Wt 116.0 lb

## 2016-11-25 DIAGNOSIS — I2510 Atherosclerotic heart disease of native coronary artery without angina pectoris: Secondary | ICD-10-CM

## 2016-11-25 DIAGNOSIS — R0609 Other forms of dyspnea: Secondary | ICD-10-CM | POA: Diagnosis not present

## 2016-11-25 NOTE — Progress Notes (Signed)
Cardiology Office Note   Date:  11/25/2016   ID:  Natalie [name], DOB 05-23-1946, MRN [id number]  PCP:  Rica Mast, MD  Cardiologist:  Dr Sallyanne Kuster 2014 Rosaria Ferries, PA-C 10/20/2016   History of Present Illness: Natalie [name] is a 70 y.o. female with a history of COPD (now improved), allergies, esoph spasm w/ dilatation, sinusitis, FH CAD, no FH SCD, nl echo 2015  11/07, seen for DOE, improved by rest>>cath scheduled>>no critical CAD w/ nl R heart pressures, med rx  Natalie [name] presents for post-hospital follow up.   Since the heart cath, she has done a little better. 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 Sometimes she has to sleep for 2 hours after a strenuous class.  She has started back on the Advair and feels it is helping her breathing. She is not wheezing. She has not been as light-headed and has not been nauseated. Her largest meal is lunch, wants to know if she can take the Lipitor then. She wakes during the night, new problem for her. She does not hurt or have to go to the bathroom. She is not wheezing or SOB. She will sit up a while and then go back to bed, but has trouble falling asleep.    Past Medical History:  Diagnosis Date  . Allergy   . Chest pain   . Nasal polyps    followed by Dr Tami Ribas    Past Surgical History:  Procedure Laterality Date  . ABDOMINAL HYSTERECTOMY  1984   due to fibroids  . APPENDECTOMY  1952  . CARDIAC CATHETERIZATION N/A 10/22/2016   Procedure: Right/Left Heart Cath and Coronary Angiography;  Surgeon: Troy Sine, MD;  Location: [facility];  Service: Cardiovascular;  Laterality: N/A;  . HEMORROIDECTOMY  1995  . NASAL SINUS SURGERY  1988, 2008  . TONSILLECTOMY  1950  . VAGINAL DELIVERY     x 3    Current Outpatient Prescriptions  Medication Sig Dispense Refill  .  acetaminophen (TYLENOL) 500 MG tablet Take 500 mg by mouth daily as needed for moderate pain or headache.    Marland Kitchen amLODipine (NORVASC) 2.5 MG tablet Take 1 tablet (2.5 mg total) by mouth daily. 30 tablet 12  . aspirin EC 81 MG tablet Take 81 mg by mouth at bedtime.     Marland Kitchen atorvastatin (LIPITOR) 40 MG tablet Take 1 tablet (40 mg total) by mouth daily at 6 PM. 30 tablet 12  . baclofen (LIORESAL) 10 MG tablet Take 10 mg by mouth at bedtime.     . cyanocobalamin 1000 MCG tablet Take 1,000 mcg by mouth daily.    Marland Kitchen EPIPEN 2-PAK 0.3 MG/0.3ML SOAJ injection Inject 0.3 mLs into the skin as needed (allergic reactions).   1  . estradiol (ESTRACE) 0.5 MG tablet Take 1 tablet (0.5 mg total) by mouth daily. (Patient taking differently: Take 0.5 mg by mouth at bedtime. ) 30 tablet 11  . fexofenadine (ALLEGRA) 180 MG tablet Take 180 mg by mouth daily.    . metoprolol tartrate (LOPRESSOR) 25 MG tablet Take 0.5 tablets (12.5 mg total) by mouth 2 (two) times daily. 30 tablet 12  . Multiple Vitamins-Minerals (CENTRUM ADULTS) TABS Take 1 tablet by mouth daily.    . Omega-3 Fatty Acids (FISH OIL) 1000 MG CAPS Take 1 tablet by mouth daily.     Marland Kitchen  Probiotic Product (DIGESTIVE ADVANTAGE PO) Take 50,000 Units by mouth 2 (two) times daily at 8 am and 10 pm.      No current facility-administered medications for this visit.     Allergies:   Aspirin; Clarithromycin; Codeine; Soma [carisoprodol]; Tramadol; and Amoxicillin-pot clavulanate    Social History:  The patient  reports that she has never smoked. She has never used smokeless tobacco. She reports that she does not drink alcohol or use drugs.   Family History:  The patient's family history includes COPD in her brother and paternal grandfather; COPD (age of onset: 69) in her father; Cancer in her daughter and paternal grandmother; Heart disease in her maternal grandfather and maternal grandmother; Heart disease (age of onset: 44) in her brother; Heart disease (age of onset:  74) in her mother; Pneumonia (age of onset: 59) in her sister.    ROS:  Please see the history of present illness. All other systems are reviewed and negative.    PHYSICAL EXAM: VS:  BP (!) 124/54   Pulse 63   Ht 4\' 11"  (1.499 m)   Wt 116 lb (52.6 kg)   BMI 23.43 kg/m  , BMI Body mass index is 23.43 kg/m. GEN: Well nourished, well developed, female in no acute distress  HEENT: normal for age  Neck: no JVD, no carotid bruit, no masses Cardiac: RRR; no murmur, no rubs, or gallops Respiratory:  clear to auscultation bilaterally, normal work of breathing GI: soft, nontender, nondistended, + BS MS: no deformity or atrophy; no edema; distal pulses are 2+ in all 4 extremities   Skin: warm and dry, no rash Neuro:  Strength and sensation are intact Psych: euthymic mood, full affect   EKG:  EKG is not ordered today.   CATH: 10/22/2016  The left ventricular systolic function is normal.  LV end diastolic pressure is normal.  Ost 1st Diag to 1st Diag lesion, 45 %stenosed.  Normal right heart pressures. Single vessel CAD with mild calcification of the LAD at its ostium without significant stenosis and 40-50% eccentric stenosis involving the ostium/proximal portion of the first diagonal vessel. RECOMMENDATION: Medical therapy.  The patient will be started on statin therapy, low-dose beta blocker and amlodipine.  She will follow-up with Dr. Sallyanne Kuster.   Recent Labs: 03/10/2016: ALT 17 10/20/2016: BUN 10; Creat 0.60; Hemoglobin 12.5; Platelets 483; Potassium 5.2; Sodium 140    Lipid Panel    Component Value Date/Time   CHOL 214 (H) 03/10/2016 1019   TRIG 70.0 03/10/2016 1019   HDL 81.80 03/10/2016 1019   CHOLHDL 3 03/10/2016 1019   VLDL 14.0 03/10/2016 1019   LDLCALC 118 (H) 03/10/2016 1019     Wt Readings from Last 3 Encounters:  11/25/16 116 lb (52.6 kg)  10/22/16 115 lb (52.2 kg)  10/20/16 115 lb 3.2 oz (52.3 kg)     Other studies Reviewed: Additional studies/  records that were reviewed today include: Office notes and testing.  ASSESSMENT AND PLAN:  1.  Chest pain, DOE: Sx have improved on Norvasc, BB and pt has restarted Advair. Cath w/ no flow-limiting disease and nl EF. Track HR w/ exertion, keep < 85% age-adjusted maximum. She has FitBit, encouraged her to wear it.   OK to continue current activity level, only think about cutting back based on how tired she is after classes. Keep f/u appt with Dr Alva Garnet.  2. CAD: Diag dz not flow-limiting, but want to stop any progression. Aggressive CRF w/ good BP control and lipid  lowering.   3. Dyslipidemia: goal LDL now 70. Continue Lipitor.     Current medicines are reviewed at length with the patient today.  The patient has concerns regarding medicines. Concerns were addressed.  The following changes have been made:  no change  Labs/ tests ordered today include:  No orders of the defined types were placed in this encounter.    Disposition:   FU with Dr Sallyanne Kuster  Signed, Rosaria Ferries, PA-C  11/25/2016 3:26 PM    [place] Phone: [phone]; Fax: [phone]  This note was written with the assistance of speech recognition software. Please excuse any transcriptional errors.

## 2016-11-25 NOTE — Patient Instructions (Addendum)
Medication Instructions:  TAKE LIPITOR AT LUNCHTIME   If you need a refill on your cardiac medications before your next appointment, please call your pharmacy.  Labwork: NONE   Testing/Procedures: NONE  Follow-Up: Your physician wants you to follow-up in: [place] will receive a reminder letter in the mail two months(CALL IN [place]) in advance. If you don't receive a letter, please call our office to schedule the follow-up appointment.  FOLLOW UP WITH RHONDA BARRETT,PA AS NEEDED   Any Other Special Instructions Will Be Listed Below (If Applicable). TRACK HEARTRATE  WHILE EXERCISING, ETC...SHOULD NEVER GO OVER 125 BEATS-PER-MINUTE, CALL us IF IT IS CONSISTENTLY OVER 125 BEATS PER MINUTE   Thank you for choosing [facility]!!

## 2016-11-26 DIAGNOSIS — J301 Allergic rhinitis due to pollen: Secondary | ICD-10-CM | POA: Diagnosis not present

## 2016-12-02 DIAGNOSIS — J301 Allergic rhinitis due to pollen: Secondary | ICD-10-CM | POA: Diagnosis not present

## 2016-12-03 ENCOUNTER — Encounter: Payer: Self-pay | Admitting: Pulmonary Disease

## 2016-12-03 ENCOUNTER — Ambulatory Visit (INDEPENDENT_AMBULATORY_CARE_PROVIDER_SITE_OTHER): Payer: Medicare Other | Admitting: Pulmonary Disease

## 2016-12-03 VITALS — BP 112/60 | HR 69 | Ht 59.0 in | Wt 115.0 lb

## 2016-12-03 DIAGNOSIS — R490 Dysphonia: Secondary | ICD-10-CM | POA: Diagnosis not present

## 2016-12-03 DIAGNOSIS — J449 Chronic obstructive pulmonary disease, unspecified: Secondary | ICD-10-CM | POA: Diagnosis not present

## 2016-12-03 DIAGNOSIS — K219 Gastro-esophageal reflux disease without esophagitis: Secondary | ICD-10-CM | POA: Diagnosis not present

## 2016-12-03 DIAGNOSIS — I2510 Atherosclerotic heart disease of native coronary artery without angina pectoris: Secondary | ICD-10-CM | POA: Diagnosis not present

## 2016-12-03 MED ORDER — FAMOTIDINE 20 MG PO TABS: 20.0000 mg | ORAL_TABLET | Freq: Every day | ORAL | 10 refills | Status: DC

## 2016-12-03 MED ORDER — MONTELUKAST SODIUM 10 MG PO TABS: 10.0000 mg | ORAL_TABLET | Freq: Every day | ORAL | 10 refills | Status: DC

## 2016-12-03 NOTE — Patient Instructions (Addendum)
Two new medications 1) Singulair 10 mg daily - may be taken in morning or @ bedtime 2) Pepcid 20 mg - take one hour before bedtime  Follow up in 6-8 weeks

## 2016-12-09 DIAGNOSIS — J309 Allergic rhinitis, unspecified: Secondary | ICD-10-CM | POA: Diagnosis not present

## 2016-12-09 DIAGNOSIS — J310 Chronic rhinitis: Secondary | ICD-10-CM | POA: Diagnosis not present

## 2016-12-09 DIAGNOSIS — R05 Cough: Secondary | ICD-10-CM | POA: Diagnosis not present

## 2016-12-09 NOTE — Progress Notes (Signed)
PULMONARY OFFICE FOLLOW UP NOTE   PT ID: 70 y.o. F never smoker previously followed by Dr Stevenson Clinch with diagnosis of mild COPD  PROBLEMS:  Mild COPD  Exertional dyspnea out of proportion to Spirometry findings Single vessel CAD - medical Rx  DATA: PFTs 11/20/14: mild obstruction, FEV1 1.47 liters, 88% predicted Spirometry 12/03/16: Mild obstruction, FEV1 1.57 liters, 88% predicted  INTERVAL HISTORY: Seen by Cardiology week prior to this visit. It was recommended that she see pulmonary for exertional dyspnea.   SUBJ: She states that her "breathing has gotten so bad". 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 She is unable to tolerate Symibcort and albuterol inhaler as previously prescribed due to tremor and palpitations. She has recently developed hoarseness. She was started on a Zpak and prednsione 12/18 by her primary MD for cough and increased dyspnea  OBJ: Vitals:   12/03/16 1006  BP: 112/60  Pulse: 69  SpO2: 97%  Weight: 115 lb (52.2 kg)  Height: 4\' 11"  (1.499 m)  RA  Gen: WDWN in NAD HEENT: All WNL Neck: NO LAN, no JVD noted Lungs: full BS, no adventitious sounds Cardiovascular: Reg rate, normal rhythm, no M noted Abdomen: Soft, NT +BS Ext: no C/C/E Neuro: CNs intact, motor/sens grossly intact Skin: No lesions noted  DATA: Spirometry as above  IMPRESSION: 1) Mild COPD with no prior smoking history - suspect chronic obstructive asthma.  2) Exertional dyspnea out of proportion to PFT findings - there might be an exercise induced bronchospasm 3) Hoarseness - suspect LPR which might be exacerbating her airways disease 4) intolerance to beta agonists - tachycardia and tremor   PLAN: 1) Singulair 10 mg daily - may be taken in morning or @ bedtime 2) Pepcid 20 mg - take one hour before bedtime 3) Follow up in 6-8 weeks   Natalie Border, MD PCCM service Mobile [phone] Pager  [phone] 12/09/2016

## 2016-12-10 DIAGNOSIS — J301 Allergic rhinitis due to pollen: Secondary | ICD-10-CM | POA: Diagnosis not present

## 2016-12-24 DIAGNOSIS — R0602 Shortness of breath: Secondary | ICD-10-CM | POA: Diagnosis not present

## 2016-12-24 DIAGNOSIS — J301 Allergic rhinitis due to pollen: Secondary | ICD-10-CM | POA: Diagnosis not present

## 2016-12-24 DIAGNOSIS — Z86010 Personal history of colonic polyps: Secondary | ICD-10-CM | POA: Diagnosis not present

## 2016-12-24 DIAGNOSIS — K219 Gastro-esophageal reflux disease without esophagitis: Secondary | ICD-10-CM | POA: Diagnosis not present

## 2016-12-24 DIAGNOSIS — R1310 Dysphagia, unspecified: Secondary | ICD-10-CM | POA: Diagnosis not present

## 2016-12-25 DIAGNOSIS — J301 Allergic rhinitis due to pollen: Secondary | ICD-10-CM | POA: Diagnosis not present

## 2017-01-13 DIAGNOSIS — J301 Allergic rhinitis due to pollen: Secondary | ICD-10-CM | POA: Diagnosis not present

## 2017-01-20 DIAGNOSIS — J301 Allergic rhinitis due to pollen: Secondary | ICD-10-CM | POA: Diagnosis not present

## 2017-01-21 ENCOUNTER — Ambulatory Visit (INDEPENDENT_AMBULATORY_CARE_PROVIDER_SITE_OTHER): Payer: Medicare Other

## 2017-01-21 VITALS — BP 112/62 | HR 61 | Temp 97.8°F | Resp 12 | Ht 59.25 in | Wt 116.4 lb

## 2017-01-21 DIAGNOSIS — Z0000 Encounter for general adult medical examination without abnormal findings: Secondary | ICD-10-CM

## 2017-01-21 DIAGNOSIS — Z23 Encounter for immunization: Secondary | ICD-10-CM | POA: Diagnosis not present

## 2017-01-21 NOTE — Patient Instructions (Addendum)
  [name] , Thank you for taking time to come for your Medicare Wellness Visit. I appreciate your ongoing commitment to your health goals. Please review the following plan we discussed and let me know if I can assist you in the future.   Follow up with Natalie Paris, FNP as needed.  These are the goals we discussed: Goals    . Healthy Lifestyle          Stay hydrated! Drink plenty of fluids Maintain exercise regimen Choose lean meats,fruits and vegetables       This is a list of the screening recommended for you and due dates:  Health Maintenance  Topic Date Due  .  Hepatitis C: One time screening is recommended by Center for Disease Control  (CDC) for  adults born from [birth year] through 1966.   28-Dec-1945  . Tetanus Vaccine  12/14/2016  . Mammogram  02/03/2018  . Colon Cancer Screening  12/28/2020  . Flu Shot  Addressed  . DEXA scan (bone density measurement)  Completed  . Shingles Vaccine  Completed  . Pneumonia vaccines  Completed

## 2017-01-21 NOTE — Progress Notes (Signed)
Subjective:   Natalie [name] is a 71 y.o. female who presents for Medicare Annual (Subsequent) preventive examination.  Review of Systems:  No ROS.  Medicare Wellness Visit.  Cardiac Risk Factors include: advanced age (>20men, >75 women)     Objective:     Vitals: BP 112/62 (BP Location: Left Arm, Patient Position: Sitting, Cuff Size: Normal)   Pulse 61   Temp 97.8 F (36.6 C) (Oral)   Resp 12   Ht 4' 11.25" (1.505 m)   Wt 116 lb 6.4 oz (52.8 kg)   SpO2 96%   BMI 23.31 kg/m   Body mass index is 23.31 kg/m.   Tobacco History  Smoking Status  . Never Smoker  Smokeless Tobacco  . Never Used     Counseling given: Not Answered   Past Medical History:  Diagnosis Date  . Allergy   . Chest pain   . Nasal polyps    followed by Dr Tami Ribas   Past Surgical History:  Procedure Laterality Date  . ABDOMINAL HYSTERECTOMY  1984   due to fibroids  . APPENDECTOMY  1952  . CARDIAC CATHETERIZATION N/A 10/22/2016   Procedure: Right/Left Heart Cath and Coronary Angiography;  Surgeon: Troy Sine, MD;  Location: [facility];  Service: Cardiovascular;  Laterality: N/A;  . HEMORROIDECTOMY  1995  . NASAL SINUS SURGERY  1988, 2008  . TONSILLECTOMY  1950  . VAGINAL DELIVERY     x 3   Family History  Problem Relation Age of Onset  . Heart disease Mother 65  . COPD Father 63  . Heart disease Brother 64    CABG at 39, Still living  . Cancer Daughter     Ovary  . Heart disease Maternal Grandmother   . Heart disease Maternal Grandfather   . Cancer Paternal Grandmother     Ovary - 11's and again in 53's  . COPD Paternal Grandfather   . Pneumonia Sister 33  . COPD Sister   . Breast cancer Neg Hx    History  Sexual Activity  . Sexual activity: Not Currently    Outpatient Encounter Prescriptions as of 01/21/2017  Medication Sig  . acetaminophen (TYLENOL) 500 MG tablet Take 500 mg by mouth daily as needed for moderate pain or headache.  Marland Kitchen amLODipine  (NORVASC) 2.5 MG tablet Take 1 tablet (2.5 mg total) by mouth daily.  Marland Kitchen aspirin EC 81 MG tablet Take 81 mg by mouth at bedtime.   Marland Kitchen atorvastatin (LIPITOR) 40 MG tablet Take 1 tablet (40 mg total) by mouth daily at 6 PM.  . baclofen (LIORESAL) 10 MG tablet Take 10 mg by mouth at bedtime.   Marland Kitchen EPIPEN 2-PAK 0.3 MG/0.3ML SOAJ injection Inject 0.3 mLs into the skin as needed (allergic reactions).   Marland Kitchen estradiol (ESTRACE) 0.5 MG tablet Take 1 tablet (0.5 mg total) by mouth daily. (Patient taking differently: Take 0.5 mg by mouth at bedtime. )  . fexofenadine (ALLEGRA) 180 MG tablet Take 180 mg by mouth daily.  . metoprolol tartrate (LOPRESSOR) 25 MG tablet Take 0.5 tablets (12.5 mg total) by mouth 2 (two) times daily.  . montelukast (SINGULAIR) 10 MG tablet Take 1 tablet (10 mg total) by mouth daily.  Marland Kitchen omeprazole (PRILOSEC) 40 MG capsule Take by mouth.  . Probiotic Product (DIGESTIVE ADVANTAGE PO) Take 50,000 Units by mouth 2 (two) times daily at 8 am and 10 pm.   . [DISCONTINUED] azithromycin (ZITHROMAX) 250 MG tablet Take by mouth daily.  . [  DISCONTINUED] famotidine (PEPCID) 20 MG tablet Take 1 tablet (20 mg total) by mouth daily.  . [DISCONTINUED] predniSONE (DELTASONE) 10 MG tablet Take 20 mg by mouth daily with breakfast.   No facility-administered encounter medications on file as of 01/21/2017.     Activities of Daily Living In your present state of health, do you have any difficulty performing the following activities: 01/21/2017 10/22/2016  Hearing? N N  Vision? N N  Difficulty concentrating or making decisions? N N  Walking or climbing stairs? Y N  Dressing or bathing? N N  Doing errands, shopping? N -  Preparing Food and eating ? N -  Using the Toilet? N -  In the past six months, have you accidently leaked urine? N -  Do you have problems with loss of bowel control? N -  Managing your Medications? N -  Managing your Finances? N -  Housekeeping or managing your Housekeeping? N -  Some  recent data might be hidden    Patient Care Team: Burnard Hawthorne, FNP as PCP - General (Family Medicine) Beverly Gust, MD (Otolaryngology)    Assessment:     This is a routine wellness examination for Natalie [name]. The goal of the wellness visit is to assist the patient how to close the gaps in care and create a preventative care plan for the patient.   Osteoporosis risk reviewed.  Medications reviewed; taking without issues or barriers.  Safety issues reviewed; smoke detectors in the home. No firearms in the home. Wears seatbelts when driving or riding with others. No violence in the home.  No identified risk were noted; The patient was oriented x 3; appropriate in dress and manner and no objective failures at ADL's or IADL's.   BMI; discussed the importance of a healthy diet, water intake and exercise. Educational material provided.  Hepatitis C Screening; discussed. Educational information provided.  J44.9 COPD-stable and followed by Dr. Rosita Fire.  TDAP vaccine deferred for follow up with insurance.    Patient Concerns: None at this time. Follow up with PCP as needed.  Exercise Activities and Dietary recommendations Current Exercise Habits: Structured exercise class, Type of exercise: calisthenics;strength training/weights;stretching;treadmill, Time (Minutes): 60, Frequency (Times/Week): 5, Weekly Exercise (Minutes/Week): 300, Intensity: Intense  Goals    . Healthy Lifestyle          Stay hydrated! Drink plenty of fluids Maintain exercise regimen Choose lean meats,fruits and vegetables      Fall Risk Fall Risk  01/21/2017 01/21/2016 11/07/2013 11/08/2012  Falls in the past year? No No No No   Depression Screen PHQ 2/9 Scores 01/21/2017 01/21/2016 11/07/2013 11/08/2012  PHQ - 2 Score 0 0 0 0     Cognitive Function MMSE - Mini Mental State Exam 01/21/2016  Orientation to time 5  Orientation to Place 5  Registration 3  Attention/ Calculation 5  Recall 3    Language- name 2 objects 2  Language- repeat 1  Language- follow 3 step command 3  Language- read & follow direction 1  Write a sentence 1  Copy design 1  Total score 30     6CIT Screen 01/21/2017  What Year? 0 points  What month? 0 points  What time? 0 points  Count back from 20 0 points  Months in reverse 0 points  Repeat phrase 0 points  Total Score 0    Immunization History  Administered Date(s) Administered  . Influenza Split 12/02/2009, 11/08/2012  . Influenza, High Dose Seasonal PF 01/21/2017  . Influenza,inj,Quad  PF,36+ Mos 11/07/2013, 10/01/2014  . Pneumococcal Conjugate-13 12/14/2009  . Pneumococcal Polysaccharide-23 01/21/2016  . Td 12/14/2006  . Zoster 12/14/2010   Screening Tests Health Maintenance  Topic Date Due  . Hepatitis C Screening  1946/04/24  . TETANUS/TDAP  12/14/2016  . MAMMOGRAM  02/03/2018  . COLONOSCOPY  12/28/2020  . INFLUENZA VACCINE  Addressed  . DEXA SCAN  Completed  . ZOSTAVAX  Completed  . PNA vac Low Risk Adult  Completed      Plan:    End of life planning; Advance aging; Advanced directives discussed. No HCPOA/Living Will.  Additional information declined at this time.  Medicare Attestation I have personally reviewed: The patient's medical and social history Their use of alcohol, tobacco or illicit drugs Their current medications and supplements The patient's functional ability including ADLs,fall risks, home safety risks, cognitive, and hearing and visual impairment Diet and physical activities Evidence for depression   The patient's weight, height, BMI, and visual acuity have been recorded in the chart.  I have made referrals and provided education to the patient based on review of the above and I have provided the patient with a written personalized care plan for preventive services.    During the course of the visit the patient was educated and counseled about the following appropriate screening and preventive services:    Vaccines to include Pneumoccal, Influenza, Hepatitis B, Td, Zostavax, HCV  Electrocardiogram  Cardiovascular Disease  Colorectal cancer screening  Bone density screening  Diabetes screening  Glaucoma screening  Mammography/PAP  Nutrition counseling   Patient Instructions (the written plan) was given to the patient.   Varney Biles, LPN  [id number]   Agree with above plan. Mable Paris, NP

## 2017-01-26 ENCOUNTER — Ambulatory Visit: Payer: Medicare Other | Admitting: Pulmonary Disease

## 2017-01-26 ENCOUNTER — Ambulatory Visit: Payer: Medicare Other | Admitting: Internal Medicine

## 2017-01-27 DIAGNOSIS — J301 Allergic rhinitis due to pollen: Secondary | ICD-10-CM | POA: Diagnosis not present

## 2017-02-01 ENCOUNTER — Ambulatory Visit (INDEPENDENT_AMBULATORY_CARE_PROVIDER_SITE_OTHER): Payer: Medicare Other | Admitting: Pulmonary Disease

## 2017-02-01 ENCOUNTER — Encounter: Payer: Self-pay | Admitting: Pulmonary Disease

## 2017-02-01 VITALS — BP 118/68 | HR 63 | Wt 115.0 lb

## 2017-02-01 DIAGNOSIS — R0609 Other forms of dyspnea: Secondary | ICD-10-CM

## 2017-02-01 DIAGNOSIS — J449 Chronic obstructive pulmonary disease, unspecified: Secondary | ICD-10-CM

## 2017-02-01 MED ORDER — BUDESONIDE-FORMOTEROL FUMARATE 160-4.5 MCG/ACT IN AERO: 2.0000 | INHALATION_SPRAY | Freq: Every day | RESPIRATORY_TRACT | 10 refills | Status: DC

## 2017-02-01 NOTE — Progress Notes (Signed)
PULMONARY OFFICE FOLLOW UP NOTE   PT ID: 71 y.o. F never smoker previously followed by Dr Stevenson Clinch with diagnosis of mild COPD  PROBLEMS:  Mild COPD  Exertional dyspnea out of proportion to Spirometry findings Single vessel CAD - medical Rx  DATA: PFTs 11/20/14: mild obstruction, FEV1 1.47 liters, 88% predicted Spirometry 12/03/16: Mild obstruction, FEV1 1.57 liters, 88% predicted  INTERVAL HISTORY:  SUBJ: SOB is unchanged but improved lightheadedness with exertion. 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 Denies CP, fever, purulent sputum, hemoptysis, LE edema and calf tenderness. She notes that she previously thought that Symbicort was beneficial  OBJ: Vitals:   02/01/17 1333  BP: 118/68  Pulse: 63  SpO2: 96%  Weight: 115 lb (52.2 kg)  RA  Gen: WDWN in NAD HEENT: All WNL Neck: NO LAN, no JVD noted Lungs: full BS, no adventitious sounds Cardiovascular: Reg rate, normal rhythm, no M noted Abdomen: Soft, NT +BS Ext: no C/C/E Neuro: CNs intact, motor/sens grossly intact Skin: No lesions noted  DATA: Spirometry as above  IMPRESSION: 1) Mild COPD/chronic obstructive asthma - she has never smoked  2) Sense of dyspnea out of proportion to PFT findings  3) Hoarseness - essentially resolved. Has been changed to omeprazole by primary MD   PLAN: 1) Cont Singulair 10 mg daily - may be taken in morning or @ bedtime 2) Continue omeprazole as previously ordered by primary MD 3) retrial of Symbicort - 160/4.5 strength 4) F/U in 3 months   Merton Border, MD PCCM service Mobile [phone] Pager [phone] 02/01/2017

## 2017-02-01 NOTE — Patient Instructions (Addendum)
1) Continue Singulair  2) Continue omeprazole (Prilosec) 3) Resume Symbicort - I have refilled as the 160/4.5 strength 4) Follow up in 3 months

## 2017-02-04 DIAGNOSIS — J301 Allergic rhinitis due to pollen: Secondary | ICD-10-CM | POA: Diagnosis not present

## 2017-02-10 DIAGNOSIS — J301 Allergic rhinitis due to pollen: Secondary | ICD-10-CM | POA: Diagnosis not present

## 2017-02-11 DIAGNOSIS — H401130 Primary open-angle glaucoma, bilateral, stage unspecified: Secondary | ICD-10-CM | POA: Diagnosis not present

## 2017-02-16 ENCOUNTER — Ambulatory Visit: Payer: Medicare Other | Admitting: Family

## 2017-02-16 ENCOUNTER — Ambulatory Visit (INDEPENDENT_AMBULATORY_CARE_PROVIDER_SITE_OTHER): Payer: Medicare Other | Admitting: Family

## 2017-02-16 ENCOUNTER — Encounter: Payer: Self-pay | Admitting: Family

## 2017-02-16 VITALS — BP 110/68 | HR 66 | Temp 98.3°F | Wt 116.6 lb

## 2017-02-16 DIAGNOSIS — Z8041 Family history of malignant neoplasm of ovary: Secondary | ICD-10-CM

## 2017-02-16 DIAGNOSIS — K219 Gastro-esophageal reflux disease without esophagitis: Secondary | ICD-10-CM

## 2017-02-16 DIAGNOSIS — N951 Menopausal and female climacteric states: Secondary | ICD-10-CM

## 2017-02-16 DIAGNOSIS — I10 Essential (primary) hypertension: Secondary | ICD-10-CM | POA: Insufficient documentation

## 2017-02-16 DIAGNOSIS — Z1239 Encounter for other screening for malignant neoplasm of breast: Secondary | ICD-10-CM

## 2017-02-16 DIAGNOSIS — J449 Chronic obstructive pulmonary disease, unspecified: Secondary | ICD-10-CM | POA: Diagnosis not present

## 2017-02-16 DIAGNOSIS — Z1231 Encounter for screening mammogram for malignant neoplasm of breast: Secondary | ICD-10-CM | POA: Diagnosis not present

## 2017-02-16 MED ORDER — ESTRADIOL 0.5 MG PO TABS: 0.5000 mg | ORAL_TABLET | Freq: Every day | ORAL | 1 refills | Status: DC

## 2017-02-16 MED ORDER — ESTROGENS, CONJUGATED 0.625 MG/GM VA CREA: TOPICAL_CREAM | VAGINAL | 2 refills | Status: DC

## 2017-02-16 NOTE — Progress Notes (Signed)
Subjective:    Patient ID: Natalie [name], female    DOB: 1946/02/05, 71 y.o.   MRN: [id number]  CC: [name] is a 71 y.o. female who presents today for follow up.   HPI:  HRT- Has been on estrace for 30+ years. No vaginal bleeding. Complains of vaginal dryness. Had tried premarin cream with Dr. Gilford Rile.    HTN- compliant with medication. Denies exertional chest pain or pressure, numbness or tingling radiating to left arm or jaw, palpitations, dizziness, frequent headaches, changes in vision.  COPD- Dr Alva Garnet. On symbicort, singulair; seems to 'be helping.' SOB- has seen cardiology 11/2016 and had cath 10/22/2016  GERD- on baclofen for esophageal spasm, started by GI; very rarely has epigastric burning.   Family h/o ovarian cancer with daughter and grandmother. Had followed with GYN for a long time however none recently. No vaginal bleeding or dyspareunia. No abdominal pain or distention. CT adbomen 2013 normal         HISTORY:  Past Medical History:  Diagnosis Date  . Allergy   . Chest pain   . Nasal polyps    followed by Dr Tami Ribas   Past Surgical History:  Procedure Laterality Date  . ABDOMINAL HYSTERECTOMY  1984   due to fibroids  . APPENDECTOMY  1952  . CARDIAC CATHETERIZATION N/A 10/22/2016   Procedure: Right/Left Heart Cath and Coronary Angiography;  Surgeon: Troy Sine, MD;  Location: [facility];  Service: Cardiovascular;  Laterality: N/A;  . HEMORROIDECTOMY  1995  . NASAL SINUS SURGERY  1988, 2008  . TONSILLECTOMY  1950  . VAGINAL DELIVERY     x 3   Family History  Problem Relation Age of Onset  . Heart disease Mother 70  . COPD Father 50  . Heart disease Brother 32    CABG at 61, Still living  . Cancer Daughter 41    Ovary  . Heart disease Maternal Grandmother   . Heart disease Maternal Grandfather   . Cancer Paternal Grandmother     Ovary - 71's and again in 75's  . COPD Paternal Grandfather   . Pneumonia  Sister 45  . COPD Sister   . Breast cancer Neg Hx     Allergies: Aspirin; Clarithromycin; Codeine; Soma [carisoprodol]; Tramadol; and Amoxicillin-pot clavulanate Current Outpatient Prescriptions on File Prior to Visit  Medication Sig Dispense Refill  . amLODipine (NORVASC) 2.5 MG tablet Take 1 tablet (2.5 mg total) by mouth daily. 30 tablet 12  . aspirin EC 81 MG tablet Take 81 mg by mouth at bedtime.     Marland Kitchen atorvastatin (LIPITOR) 40 MG tablet Take 1 tablet (40 mg total) by mouth daily at 6 PM. 30 tablet 12  . baclofen (LIORESAL) 10 MG tablet Take 10 mg by mouth at bedtime.     . budesonide-formoterol (SYMBICORT) 160-4.5 MCG/ACT inhaler Inhale 2 puffs into the lungs daily. 1 Inhaler 10  . EPIPEN 2-PAK 0.3 MG/0.3ML SOAJ injection Inject 0.3 mLs into the skin as needed (allergic reactions).   1  . fexofenadine (ALLEGRA) 180 MG tablet Take 180 mg by mouth daily.    . metoprolol tartrate (LOPRESSOR) 25 MG tablet Take 0.5 tablets (12.5 mg total) by mouth 2 (two) times daily. 30 tablet 12  . Probiotic Product (DIGESTIVE ADVANTAGE PO) Take 50,000 Units by mouth 2 (two) times daily at 8 am and 10 pm.      No current facility-administered medications on file prior to visit.  Social History  Substance Use Topics  . Smoking status: Never Smoker  . Smokeless tobacco: Never Used  . Alcohol use No    Review of Systems  Constitutional: Negative for chills and fever.  Respiratory: Negative for cough.   Cardiovascular: Negative for chest pain and palpitations.  Gastrointestinal: Negative for abdominal distention, abdominal pain, nausea and vomiting.  Genitourinary: Negative for dyspareunia, urgency, vaginal bleeding, vaginal discharge and vaginal pain.      Objective:    BP 110/68 (BP Location: Left Arm, Patient Position: Sitting, Cuff Size: Normal)   Pulse 66   Temp 98.3 F (36.8 C) (Oral)   Wt 116 lb 9.6 oz (52.9 kg)   SpO2 96%   BMI 23.35 kg/m  BP Readings from Last 3 Encounters:    02/16/17 110/68  02/01/17 118/68  01/21/17 112/62   Wt Readings from Last 3 Encounters:  02/16/17 116 lb 9.6 oz (52.9 kg)  02/01/17 115 lb (52.2 kg)  01/21/17 116 lb 6.4 oz (52.8 kg)    Physical Exam  Constitutional: She appears well-developed and well-nourished.  Eyes: Conjunctivae are normal.  Cardiovascular: Normal rate, regular rhythm, normal heart sounds and normal pulses.   Pulmonary/Chest: Effort normal and breath sounds normal. She has no wheezes. She has no rhonchi. She has no rales.  Neurological: She is alert.  Skin: Skin is warm and dry.  Psychiatric: She has a normal mood and affect. Her speech is normal and behavior is normal. Thought content normal.  Vitals reviewed.      Assessment & Plan:   Problem List Items Addressed This Visit      Cardiovascular and Mediastinum   HTN (hypertension)    At goal. Continue current regimen.        Respiratory   COPD, mild (HCC)    Stable. Doing well on Symbicort. Continues to follow with pulmonology, Dr. Alva Garnet        Digestive   GERD (gastroesophageal reflux disease)    Well-controlled. Using baclofen. Declines PPI today.        Other   Menopausal hot flushes - Primary    Discussed risk from hormone eplacement therapy. Patient is well aware. She is due for mammogram and that was ordered today. She also complains of vaginal atrophy wanted to try Estrace as she is done in the past with prior PCP. Patient will also try to take less oral Estrace. F/u 1-2 months.      Relevant Medications   conjugated estrogens (PREMARIN) vaginal cream   estradiol (ESTRACE) 0.5 MG tablet   Family history of ovarian cancer    Discussed risk factors for ovarian cancer. Patient is clinically not having symptoms today. We also discussed the lack of recommendations to pursue screening. Patient is to discuss possibly screening with her daughter and let me know at follow-up if she like pursue genetic counselor, CT abdomen, CA 125 on a  regular or one time basis.        Other Visit Diagnoses    Screening for breast cancer       Relevant Orders   MM DIGITAL SCREENING BILATERAL       I have discontinued Ms. Haithcock's acetaminophen, montelukast, and omeprazole. I am also having her start on conjugated estrogens. Additionally, I am having her maintain her aspirin EC, baclofen, EPIPEN 2-PAK, fexofenadine, Probiotic Product (DIGESTIVE ADVANTAGE PO), amLODipine, atorvastatin, metoprolol tartrate, budesonide-formoterol, and estradiol.   Meds ordered this encounter  Medications  . conjugated estrogens (PREMARIN) vaginal cream  Sig: 0.5 g intravaginally twice per week then taper to maintenance dose of a couple times per week.    Dispense:  30 g    Refill:  2    Order Specific Question:   Supervising Provider    Answer:   Derrel Nip, TERESA L [[id number]]  . estradiol (ESTRACE) 0.5 MG tablet    Sig: Take 1 tablet (0.5 mg total) by mouth daily.    Dispense:  60 tablet    Refill:  1    Order Specific Question:   Supervising Provider    Answer:   Crecencio Mc [[id number]]    Return precautions given.   Risks, benefits, and alternatives of the medications and treatment plan prescribed today were discussed, and patient expressed understanding.   Education regarding symptom management and diagnosis given to patient on AVS.  Continue to follow with Mable Paris, FNP for routine health maintenance.   Natalie [name] and I agreed with plan.   Mable Paris, FNP

## 2017-02-16 NOTE — Progress Notes (Signed)
Pre visit review using our clinic review tool, if applicable. No additional management support is needed unless otherwise documented below in the visit note. 

## 2017-02-16 NOTE — Assessment & Plan Note (Signed)
Stable. Doing well on Symbicort. Continues to follow with pulmonology, Dr. Alva Garnet

## 2017-02-16 NOTE — Patient Instructions (Signed)
Follow up in 2 months  Want to discuss further if we do screening for ovarian cancer.   Things to consider CT abdomen, CA-125 ( lab) and genetics counselor.  We will do labs at next visit

## 2017-02-16 NOTE — Assessment & Plan Note (Signed)
Discussed risk from hormone eplacement therapy. Patient is well aware. She is due for mammogram and that was ordered today. She also complains of vaginal atrophy wanted to try Estrace as she is done in the past with prior PCP. Patient will also try to take less oral Estrace. F/u 1-2 months.

## 2017-02-16 NOTE — Assessment & Plan Note (Signed)
Discussed risk factors for ovarian cancer. Patient is clinically not having symptoms today. We also discussed the lack of recommendations to pursue screening. Patient is to discuss possibly screening with her daughter and let me know at follow-up if she like pursue genetic counselor, CT abdomen, CA 125 on a regular or one time basis.

## 2017-02-16 NOTE — Assessment & Plan Note (Signed)
Well-controlled. Using baclofen. Declines PPI today.

## 2017-02-16 NOTE — Assessment & Plan Note (Signed)
At goal. Continue current regimen. 

## 2017-02-17 DIAGNOSIS — J301 Allergic rhinitis due to pollen: Secondary | ICD-10-CM | POA: Diagnosis not present

## 2017-02-24 DIAGNOSIS — J301 Allergic rhinitis due to pollen: Secondary | ICD-10-CM | POA: Diagnosis not present

## 2017-03-02 ENCOUNTER — Encounter: Payer: Self-pay | Admitting: *Deleted

## 2017-03-03 ENCOUNTER — Ambulatory Visit: Payer: Medicare Other | Admitting: Anesthesiology

## 2017-03-03 ENCOUNTER — Encounter: Payer: Self-pay | Admitting: *Deleted

## 2017-03-03 ENCOUNTER — Ambulatory Visit
Admission: RE | Admit: 2017-03-03 | Discharge: 2017-03-03 | Disposition: A | Discharge status: Home / Self Care | Payer: Medicare Other | Source: Ambulatory Visit | Attending: Unknown Physician Specialty | Admitting: Unknown Physician Specialty

## 2017-03-03 ENCOUNTER — Encounter
Admission: RE | Disposition: A | Discharge status: Home / Self Care | Payer: Self-pay | Source: Ambulatory Visit | Attending: Unknown Physician Specialty

## 2017-03-03 DIAGNOSIS — Z836 Family history of other diseases of the respiratory system: Secondary | ICD-10-CM | POA: Insufficient documentation

## 2017-03-03 DIAGNOSIS — Z7982 Long term (current) use of aspirin: Secondary | ICD-10-CM | POA: Insufficient documentation

## 2017-03-03 DIAGNOSIS — K640 First degree hemorrhoids: Secondary | ICD-10-CM | POA: Diagnosis not present

## 2017-03-03 DIAGNOSIS — K635 Polyp of colon: Secondary | ICD-10-CM | POA: Diagnosis not present

## 2017-03-03 DIAGNOSIS — K5730 Diverticulosis of large intestine without perforation or abscess without bleeding: Secondary | ICD-10-CM | POA: Insufficient documentation

## 2017-03-03 DIAGNOSIS — Z8719 Personal history of other diseases of the digestive system: Secondary | ICD-10-CM | POA: Diagnosis not present

## 2017-03-03 DIAGNOSIS — Z86010 Personal history of colonic polyps: Secondary | ICD-10-CM | POA: Diagnosis not present

## 2017-03-03 DIAGNOSIS — Z886 Allergy status to analgesic agent status: Secondary | ICD-10-CM | POA: Insufficient documentation

## 2017-03-03 DIAGNOSIS — K648 Other hemorrhoids: Secondary | ICD-10-CM | POA: Diagnosis not present

## 2017-03-03 DIAGNOSIS — Z885 Allergy status to narcotic agent status: Secondary | ICD-10-CM | POA: Diagnosis not present

## 2017-03-03 DIAGNOSIS — Z79818 Long term (current) use of other agents affecting estrogen receptors and estrogen levels: Secondary | ICD-10-CM | POA: Diagnosis not present

## 2017-03-03 DIAGNOSIS — Z1211 Encounter for screening for malignant neoplasm of colon: Secondary | ICD-10-CM | POA: Insufficient documentation

## 2017-03-03 DIAGNOSIS — Z8249 Family history of ischemic heart disease and other diseases of the circulatory system: Secondary | ICD-10-CM | POA: Diagnosis not present

## 2017-03-03 DIAGNOSIS — I10 Essential (primary) hypertension: Secondary | ICD-10-CM | POA: Diagnosis not present

## 2017-03-03 DIAGNOSIS — Z881 Allergy status to other antibiotic agents status: Secondary | ICD-10-CM | POA: Insufficient documentation

## 2017-03-03 DIAGNOSIS — Z888 Allergy status to other drugs, medicaments and biological substances status: Secondary | ICD-10-CM | POA: Insufficient documentation

## 2017-03-03 DIAGNOSIS — D123 Benign neoplasm of transverse colon: Secondary | ICD-10-CM | POA: Insufficient documentation

## 2017-03-03 DIAGNOSIS — Z7951 Long term (current) use of inhaled steroids: Secondary | ICD-10-CM | POA: Insufficient documentation

## 2017-03-03 DIAGNOSIS — K5790 Diverticulosis of intestine, part unspecified, without perforation or abscess without bleeding: Secondary | ICD-10-CM | POA: Diagnosis not present

## 2017-03-03 DIAGNOSIS — Z8041 Family history of malignant neoplasm of ovary: Secondary | ICD-10-CM | POA: Insufficient documentation

## 2017-03-03 DIAGNOSIS — Z79899 Other long term (current) drug therapy: Secondary | ICD-10-CM | POA: Diagnosis not present

## 2017-03-03 DIAGNOSIS — K219 Gastro-esophageal reflux disease without esophagitis: Secondary | ICD-10-CM | POA: Insufficient documentation

## 2017-03-03 HISTORY — PX: COLONOSCOPY WITH PROPOFOL: SHX5780

## 2017-03-03 HISTORY — DX: Gastric ulcer, unspecified as acute or chronic, without hemorrhage or perforation: K25.9

## 2017-03-03 HISTORY — DX: Gastro-esophageal reflux disease without esophagitis: K21.9

## 2017-03-03 HISTORY — DX: Polyp of colon: K63.5

## 2017-03-03 SURGERY — COLONOSCOPY WITH PROPOFOL
Anesthesia: General

## 2017-03-03 MED ORDER — LIDOCAINE 2% (20 MG/ML) 5 ML SYRINGE
INTRAMUSCULAR | Status: DC | PRN
  Administered 2017-03-03: 60 mg via INTRAVENOUS

## 2017-03-03 MED ORDER — SODIUM CHLORIDE 0.9 % IV SOLN: INTRAVENOUS | Status: DC

## 2017-03-03 MED ORDER — PROPOFOL 500 MG/50ML IV EMUL
INTRAVENOUS | Status: DC | PRN
  Administered 2017-03-03: 150 ug/kg/min via INTRAVENOUS

## 2017-03-03 MED ORDER — EPHEDRINE SULFATE 50 MG/ML IJ SOLN
INTRAMUSCULAR | Status: DC | PRN
  Administered 2017-03-03: 10 mg via INTRAVENOUS

## 2017-03-03 MED ORDER — SODIUM CHLORIDE 0.9 % IV SOLN
INTRAVENOUS | Status: DC
  Administered 2017-03-03: 13:00:00 via INTRAVENOUS

## 2017-03-03 MED ORDER — PROPOFOL 10 MG/ML IV BOLUS
INTRAVENOUS | Status: DC | PRN
  Administered 2017-03-03: 30 mg via INTRAVENOUS
  Administered 2017-03-03: 50 mg via INTRAVENOUS

## 2017-03-03 MED ORDER — PHENYLEPHRINE HCL 10 MG/ML IJ SOLN
INTRAMUSCULAR | Status: DC | PRN
  Administered 2017-03-03 (×2): 100 ug via INTRAVENOUS

## 2017-03-03 MED ORDER — PROPOFOL 10 MG/ML IV BOLUS
INTRAVENOUS | Status: AC
  Filled 2017-03-03: qty 20

## 2017-03-03 NOTE — Transfer of Care (Signed)
Immediate Anesthesia Transfer of Care Note  Patient: Natalie [name]  Procedure(s) Performed: Procedure(s): COLONOSCOPY WITH PROPOFOL (N/A)  Patient Location: Endoscopy Unit  Anesthesia Type:General  Level of Consciousness: sedated  Airway & Oxygen Therapy: Patient connected to nasal cannula oxygen  Post-op Assessment: Post -op Vital signs reviewed and stable  Post vital signs: stable  Last Vitals:  Vitals:   03/03/17 1258 03/03/17 1501  BP: 119/61 (P) 122/62  Pulse: 88 (P) 64  Resp: 12 (P) 17  Temp: 36.6 C (P) 36.2 C    Last Pain:  Vitals:   03/03/17 1501  TempSrc: (P) Tympanic         Complications: No apparent anesthesia complications

## 2017-03-03 NOTE — Op Note (Signed)
[facility] Gastroenterology Patient Name: Natalie [name] Procedure Date: 03/03/2017 2:05 PM MRN: [id number] Account #: [id number] Date of Birth: [birth year] Admit Type: Outpatient Age: 71 Room: [facility] ENDO ROOM 4 Gender: Female Note Status: Finalized Procedure:            Colonoscopy Indications:          High risk colon cancer surveillance: Personal history                        of colonic polyps Providers:            Manya Silvas, MD Referring MD:         Yvetta Coder. Arnett (Referring MD) Medicines:            Propofol per Anesthesia Complications:        No immediate complications. Procedure:            Pre-Anesthesia Assessment:                       - After reviewing the risks and benefits, the patient                        was deemed in satisfactory condition to undergo the                        procedure.                       After obtaining informed consent, the colonoscope was                        passed under direct vision. Throughout the procedure,                        the patient's blood pressure, pulse, and oxygen                        saturations were monitored continuously. The                        Colonoscope was introduced through the anus and                        advanced to the the cecum, identified by appendiceal                        orifice and ileocecal valve. The colonoscopy was                        performed without difficulty. The patient tolerated the                        procedure well. The quality of the bowel preparation                        was excellent. Findings:      A diminutive polyp was found in the hepatic flexure. The polyp was       sessile. The polyp was removed with a jumbo cold forceps. Resection and       retrieval were complete.      Internal hemorrhoids were found during  endoscopy. The hemorrhoids were       small and Grade I (internal hemorrhoids that do not prolapse).      A single  small-mouthed diverticulum was found in the sigmoid colon.      The exam was otherwise without abnormality. Impression:           - One diminutive polyp at the hepatic flexure, removed                        with a jumbo cold forceps. Resected and retrieved.                       - Internal hemorrhoids.                       - Diverticulosis in the sigmoid colon.                       - The examination was otherwise normal. Recommendation:       - Await pathology results. Manya Silvas, MD 03/03/2017 3:00:45 PM This report has been signed electronically. Number of Addenda: 0 Note Initiated On: 03/03/2017 2:05 PM Scope Withdrawal Time: 0 hours 7 minutes 33 seconds  Total Procedure Duration: 0 hours 25 minutes 16 seconds       [facility]

## 2017-03-03 NOTE — H&P (Signed)
Primary Care Physician:  Mable Paris, FNP Primary Gastroenterologist:  Dr. Vira Agar  Pre-Procedure History & Physical: HPI:  Natalie [name] is a 71 y.o. female is here for an colonoscopy.   Past Medical History:  Diagnosis Date  . Allergy   . Chest pain   . Colon polyps   . Gastric ulcer   . GERD (gastroesophageal reflux disease)   . Nasal polyps    followed by Dr Tami Ribas    Past Surgical History:  Procedure Laterality Date  . ABDOMINAL HYSTERECTOMY  1984   due to fibroids  . APPENDECTOMY  1952  . CARDIAC CATHETERIZATION N/A 10/22/2016   Procedure: Right/Left Heart Cath and Coronary Angiography;  Surgeon: Troy Sine, MD;  Location: [facility];  Service: Cardiovascular;  Laterality: N/A;  . HEMORROIDECTOMY  1995  . NASAL SINUS SURGERY  1988, 2008  . TONSILLECTOMY  1950  . VAGINAL DELIVERY     x 3    Prior to Admission medications   Medication Sig Start Date End Date Taking? Authorizing Provider  amLODipine (NORVASC) 2.5 MG tablet Take 1 tablet (2.5 mg total) by mouth daily. 10/22/16  Yes Arbutus Leas, NP  aspirin EC 81 MG tablet Take 81 mg by mouth at bedtime.    Yes Historical Provider, MD  budesonide-formoterol (SYMBICORT) 160-4.5 MCG/ACT inhaler Inhale 2 puffs into the lungs daily. 02/01/17  Yes [name], MD  fexofenadine (ALLEGRA) 180 MG tablet Take 180 mg by mouth daily.   Yes Historical Provider, MD  ipratropium (ATROVENT) 0.06 % nasal spray Place 2 sprays into the nose.   Yes Historical Provider, MD  metoprolol tartrate (LOPRESSOR) 25 MG tablet Take 0.5 tablets (12.5 mg total) by mouth 2 (two) times daily. 10/22/16  Yes Arbutus Leas, NP  atorvastatin (LIPITOR) 40 MG tablet Take 1 tablet (40 mg total) by mouth daily at 6 PM. 10/22/16   Arbutus Leas, NP  baclofen (LIORESAL) 10 MG tablet Take 10 mg by mouth at bedtime.     Historical Provider, MD  conjugated estrogens (PREMARIN) vaginal cream 0.5 g intravaginally twice per week then taper to  maintenance dose of a couple times per week. 02/16/17   Burnard Hawthorne, FNP  EPIPEN 2-PAK 0.3 MG/0.3ML SOAJ injection Inject 0.3 mLs into the skin as needed (allergic reactions).  09/05/14   Historical Provider, MD  estradiol (ESTRACE) 0.5 MG tablet Take 1 tablet (0.5 mg total) by mouth daily. 02/16/17   Burnard Hawthorne, FNP  omeprazole (PRILOSEC) 40 MG capsule Take 40 mg by mouth daily. Take 30 minutes before breakfast    Historical Provider, MD  Probiotic Product (DIGESTIVE ADVANTAGE PO) Take 50,000 Units by mouth 2 (two) times daily at 8 am and 10 pm.     Historical Provider, MD    Allergies as of 02/02/2017 - Review Complete 02/01/2017  Allergen Reaction Noted  . Aspirin Shortness Of Breath 02/26/2012  . Clarithromycin Nausea And Vomiting 02/22/2012  . Codeine Nausea And Vomiting 02/22/2012  . Soma [carisoprodol] Nausea And Vomiting 02/22/2012  . Tramadol Nausea And Vomiting 02/22/2012  . Amoxicillin-pot clavulanate Nausea And Vomiting and Rash 10/23/2014    Family History  Problem Relation Age of Onset  . Heart disease Mother 69  . COPD Father 9  . Heart disease Brother 72    CABG at 85, Still living  . Cancer Daughter 35    Ovary  . Heart disease Maternal Grandmother   . Heart disease Maternal Grandfather   .  Cancer Paternal Grandmother     Ovary - 51's and again in 36's  . COPD Paternal Grandfather   . Pneumonia Sister 38  . COPD Sister   . Breast cancer Neg Hx     Social History   Social History  . Marital status: Married    Spouse name: N/A  . Number of children: 3  . Years of education: N/A   Occupational History  . Retired    Social History Main Topics  . Smoking status: Never Smoker  . Smokeless tobacco: Never Used  . Alcohol use No  . Drug use: No  . Sexual activity: Not Currently   Other Topics Concern  . Not on file   Social History Narrative   Pt lives with husband.    Review of Systems: See HPI, otherwise negative ROS  Physical  Exam: BP 119/61   Pulse 88   Temp 97.8 F (36.6 C) (Tympanic)   Resp 12   Ht 4' 11.25" (1.505 m)   Wt 52.6 kg (116 lb)   SpO2 100%   BMI 23.23 kg/m  General:   Alert,  pleasant and cooperative in NAD Head:  Normocephalic and atraumatic. Neck:  Supple; no masses or thyromegaly. Lungs:  Clear throughout to auscultation.    Heart:  Regular rate and rhythm. Abdomen:  Soft, nontender and nondistended. Normal bowel sounds, without guarding, and without rebound.   Neurologic:  Alert and  oriented x4;  grossly normal neurologically.  Impression/Plan: Natalie [name] is here for an colonoscopy to be performed for [facility] colon polyps  Risks, benefits, limitations, and alternatives regarding  colonoscopy have been reviewed with the patient.  Questions have been answered.  All parties agreeable.   Gaylyn Cheers, MD  03/03/2017, 2:28 PM

## 2017-03-03 NOTE — Anesthesia Post-op Follow-up Note (Cosign Needed)
Anesthesia QCDR form completed.        

## 2017-03-03 NOTE — Anesthesia Preprocedure Evaluation (Signed)
Anesthesia Evaluation  Patient identified by MRN, date of birth, ID band  Reviewed: Allergy & Precautions, NPO status , Patient's Chart, lab work & pertinent test results  Airway Mallampati: II       Dental  (+) Teeth Intact   Pulmonary shortness of breath,    breath sounds clear to auscultation       Cardiovascular Exercise Tolerance: Good hypertension, Pt. on medications and Pt. on home beta blockers  Rhythm:Regular Rate:Normal     Neuro/Psych negative neurological ROS     GI/Hepatic Neg liver ROS, PUD, GERD  Medicated,  Endo/Other  negative endocrine ROS  Renal/GU      Musculoskeletal   Abdominal Normal abdominal exam  (+)   Peds  Hematology negative hematology ROS (+)   Anesthesia Other Findings   Reproductive/Obstetrics                             Anesthesia Physical Anesthesia Plan  ASA: II  Anesthesia Plan: General   Post-op Pain Management:    Induction: Intravenous  Airway Management Planned: Natural Airway and Nasal Cannula  Additional Equipment:   Intra-op Plan:   Post-operative Plan:   Informed Consent: I have reviewed the patients History and Physical, chart, labs and discussed the procedure including the risks, benefits and alternatives for the proposed anesthesia with the patient or authorized representative who has indicated his/her understanding and acceptance.     Plan Discussed with: CRNA  Anesthesia Plan Comments:         Anesthesia Quick Evaluation

## 2017-03-04 ENCOUNTER — Encounter: Payer: Self-pay | Admitting: Unknown Physician Specialty

## 2017-03-04 DIAGNOSIS — J301 Allergic rhinitis due to pollen: Secondary | ICD-10-CM | POA: Diagnosis not present

## 2017-03-05 LAB — SURGICAL PATHOLOGY

## 2017-03-09 NOTE — Anesthesia Postprocedure Evaluation (Signed)
Anesthesia Post Note  Patient: Natalie [name]  Procedure(s) Performed: Procedure(s) (LRB): COLONOSCOPY WITH PROPOFOL (N/A)  Patient location during evaluation: PACU Anesthesia Type: General Level of consciousness: awake Pain management: pain level controlled Vital Signs Assessment: post-procedure vital signs reviewed and stable Respiratory status: spontaneous breathing Cardiovascular status: stable Anesthetic complications: no     Last Vitals:  Vitals:   03/03/17 1258 03/03/17 1501  BP: 119/61 122/62  Pulse: 88 64  Resp: 12 17  Temp: 36.6 C 36.2 C    Last Pain:  Vitals:   03/03/17 1501  TempSrc: Tympanic                 VAN STAVEREN,Alexarae Oliva

## 2017-03-10 DIAGNOSIS — J301 Allergic rhinitis due to pollen: Secondary | ICD-10-CM | POA: Diagnosis not present

## 2017-03-25 DIAGNOSIS — J301 Allergic rhinitis due to pollen: Secondary | ICD-10-CM | POA: Diagnosis not present

## 2017-03-26 DIAGNOSIS — J301 Allergic rhinitis due to pollen: Secondary | ICD-10-CM | POA: Diagnosis not present

## 2017-04-07 ENCOUNTER — Ambulatory Visit: Payer: Medicare Other

## 2017-04-07 DIAGNOSIS — J301 Allergic rhinitis due to pollen: Secondary | ICD-10-CM | POA: Diagnosis not present

## 2017-04-15 DIAGNOSIS — R0609 Other forms of dyspnea: Secondary | ICD-10-CM | POA: Diagnosis not present

## 2017-04-15 DIAGNOSIS — R1319 Other dysphagia: Secondary | ICD-10-CM | POA: Diagnosis not present

## 2017-04-20 ENCOUNTER — Ambulatory Visit: Payer: Medicare Other | Admitting: Family

## 2017-04-20 ENCOUNTER — Ambulatory Visit
Admission: RE | Admit: 2017-04-20 | Discharge: 2017-04-20 | Disposition: A | Discharge status: Home / Self Care | Payer: Medicare Other | Source: Ambulatory Visit | Attending: Family | Admitting: Family

## 2017-04-20 DIAGNOSIS — Z1231 Encounter for screening mammogram for malignant neoplasm of breast: Secondary | ICD-10-CM | POA: Insufficient documentation

## 2017-04-20 DIAGNOSIS — Z1239 Encounter for other screening for malignant neoplasm of breast: Secondary | ICD-10-CM

## 2017-04-21 DIAGNOSIS — J301 Allergic rhinitis due to pollen: Secondary | ICD-10-CM | POA: Diagnosis not present

## 2017-04-28 DIAGNOSIS — J301 Allergic rhinitis due to pollen: Secondary | ICD-10-CM | POA: Diagnosis not present

## 2017-05-05 DIAGNOSIS — J301 Allergic rhinitis due to pollen: Secondary | ICD-10-CM | POA: Diagnosis not present

## 2017-05-11 ENCOUNTER — Ambulatory Visit (INDEPENDENT_AMBULATORY_CARE_PROVIDER_SITE_OTHER): Payer: Medicare Other | Admitting: Pulmonary Disease

## 2017-05-11 ENCOUNTER — Encounter: Payer: Self-pay | Admitting: Pulmonary Disease

## 2017-05-11 VITALS — BP 112/58 | HR 77 | Resp 16 | Ht 58.7 in | Wt 117.0 lb

## 2017-05-11 DIAGNOSIS — R0609 Other forms of dyspnea: Secondary | ICD-10-CM | POA: Diagnosis not present

## 2017-05-11 DIAGNOSIS — J449 Chronic obstructive pulmonary disease, unspecified: Secondary | ICD-10-CM

## 2017-05-11 NOTE — Patient Instructions (Signed)
Trial of albuterol inhaler - one inhalation prior to exertion Follow-up in 4-6 weeks with lung function tests and chest x-ray prior to that visit Upon follow-up, depending on the results of the lung function tests and chest x-ray, we will consider a cardiopulmonary stress test

## 2017-05-12 DIAGNOSIS — J301 Allergic rhinitis due to pollen: Secondary | ICD-10-CM | POA: Diagnosis not present

## 2017-05-12 NOTE — Progress Notes (Signed)
PULMONARY OFFICE FOLLOW UP NOTE   PT ID: 71 y.o. F never smoker previously followed by Dr Stevenson Clinch with diagnosis of mild COPD  PROBLEMS:  Mild COPD  Exertional dyspnea out of proportion to Spirometry findings Single vessel CAD - medical Rx  DATA: PFTs 11/20/14: mild obstruction, FEV1 1.47 liters, 88% predicted Spirometry 12/03/16: Mild obstruction, FEV1 1.57 liters, 88% predicted  INTERVAL HISTORY: No major events  SUBJ: Last visit, I suggested that she resume Symbicort. She has noticed no improvement with this. She remains on Singulair. 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 She is very frustrated by this. She has undergone a recent cardiac evaluation with no obvious cardiac etiology for her exercise limitation. She denies fever, chest pain, cough, sputum production, lower extremity edema, calf tenderness, orthopnea, paroxysmal nocturnal dyspnea.  OBJ: Vitals:   05/11/17 0956  BP: (!) 112/58  Pulse: 77  Resp: 16  SpO2: 98%  Weight: 117 lb (53.1 kg)  Height: 4' 10.7" (1.491 m)  RA  Gen: WDWN in NAD HEENT: All WNL Neck: NO LAN, no JVD noted Lungs: full BS, no adventitious sounds Cardiovascular: Reg rate, normal rhythm, no M noted Abdomen: Soft, NT +BS Ext: no C/C/E Neuro: CNs intact, motor/sens grossly intact Skin: No lesions noted  DATA: No new chest x-ray or other data  IMPRESSION: 1) Mild COPD/chronic obstructive asthma - she has never smoked  2) DOE out of proportion to any objective findings    PLAN: 1) Cont Singulair 10 mg daily - may be taken in morning or @ bedtime 2) I suggested that she try a single inhalation of albuterol before exercise in the hopes that this will minimize tremor but provides some reduction in airflow obstruction. 3) follow-up in 4-6 weeks with full pulmonary function tests and chest x-ray prior to that  visit 4) upon follow-up if her dyspnea is not improved and remains out of proportion to PFT findings, we will consider formal cardio-pulmonary stress test  Merton Border, MD PCCM service Mobile [phone] Pager [phone] 05/12/2017

## 2017-05-18 ENCOUNTER — Telehealth: Payer: Self-pay | Admitting: Pulmonary Disease

## 2017-05-18 MED ORDER — ALBUTEROL SULFATE HFA 108 (90 BASE) MCG/ACT IN AERS: INHALATION_SPRAY | RESPIRATORY_TRACT | 0 refills | Status: DC

## 2017-05-18 NOTE — Telephone Encounter (Signed)
Returned call to patient in regards to inhalers. She feels like Symbicort 80 mcg is not strong enough. Per last office visit pt was to use Symbicort with a trial of Albuterol. Albuterol was not sent to pharmacy. Rescue inhaler was sent today and patient has f/u on 06/21/17. She wants to know is Symbicort dosage can be increased? Please advise

## 2017-05-19 DIAGNOSIS — J301 Allergic rhinitis due to pollen: Secondary | ICD-10-CM | POA: Diagnosis not present

## 2017-05-26 NOTE — Telephone Encounter (Signed)
Continue current strength until we see the results of PFTs prior to next visit  Merton Border, MD PCCM service Mobile [phone] Pager [phone] 05/26/2017 1:29 PM

## 2017-05-27 DIAGNOSIS — J301 Allergic rhinitis due to pollen: Secondary | ICD-10-CM | POA: Diagnosis not present

## 2017-05-27 DIAGNOSIS — H401130 Primary open-angle glaucoma, bilateral, stage unspecified: Secondary | ICD-10-CM | POA: Diagnosis not present

## 2017-06-03 ENCOUNTER — Ambulatory Visit: Payer: Medicare Other | Admitting: Family

## 2017-06-03 DIAGNOSIS — J301 Allergic rhinitis due to pollen: Secondary | ICD-10-CM | POA: Diagnosis not present

## 2017-06-08 ENCOUNTER — Ambulatory Visit: Payer: Medicare Other | Attending: Pulmonary Disease

## 2017-06-08 DIAGNOSIS — R0609 Other forms of dyspnea: Secondary | ICD-10-CM | POA: Diagnosis not present

## 2017-06-08 DIAGNOSIS — J449 Chronic obstructive pulmonary disease, unspecified: Secondary | ICD-10-CM | POA: Diagnosis not present

## 2017-06-09 DIAGNOSIS — J301 Allergic rhinitis due to pollen: Secondary | ICD-10-CM | POA: Diagnosis not present

## 2017-06-10 ENCOUNTER — Telehealth: Payer: Self-pay | Admitting: *Deleted

## 2017-06-10 NOTE — Telephone Encounter (Signed)
-----   Message from [name], MD sent at 06/09/2017  3:52 PM EDT ----- Let her know that the PFTs look good. As good or better than previous tests  Thanks  [name]

## 2017-06-10 NOTE — Telephone Encounter (Signed)
LMOM for pt to return call for results. 

## 2017-06-10 NOTE — Telephone Encounter (Signed)
Patient aware.[employer] 

## 2017-06-18 DIAGNOSIS — J301 Allergic rhinitis due to pollen: Secondary | ICD-10-CM | POA: Diagnosis not present

## 2017-06-21 ENCOUNTER — Ambulatory Visit (INDEPENDENT_AMBULATORY_CARE_PROVIDER_SITE_OTHER): Payer: Medicare Other | Admitting: Pulmonary Disease

## 2017-06-21 ENCOUNTER — Encounter: Payer: Self-pay | Admitting: Pulmonary Disease

## 2017-06-21 VITALS — BP 120/70 | HR 77 | Ht 58.7 in | Wt 119.0 lb

## 2017-06-21 DIAGNOSIS — J449 Chronic obstructive pulmonary disease, unspecified: Secondary | ICD-10-CM

## 2017-06-21 DIAGNOSIS — R0609 Other forms of dyspnea: Secondary | ICD-10-CM | POA: Diagnosis not present

## 2017-06-21 NOTE — Patient Instructions (Signed)
Continue Singulair, Symbicort, albuterol as needed  Follow-up in 6 months or sooner as needed

## 2017-06-22 ENCOUNTER — Ambulatory Visit: Payer: Medicare Other | Admitting: Family

## 2017-06-23 DIAGNOSIS — J301 Allergic rhinitis due to pollen: Secondary | ICD-10-CM | POA: Diagnosis not present

## 2017-06-24 NOTE — Progress Notes (Signed)
PULMONARY OFFICE FOLLOW UP NOTE   PT ID: 71 y.o. F never smoker previously followed by Dr Stevenson Clinch with diagnosis of mild COPD  PROBLEMS:  Mild COPD  Exertional dyspnea out of proportion to Spirometry findings Single vessel CAD - medical Rx  DATA: PFTs 11/20/14: mild obstruction, FEV1 1.47 liters, 88% predicted Spirometry 12/03/16: Mild obstruction, FEV1 1.57 liters, 88% predicted PFTs 06/08/17: mild obstruction. FEV1 1.69 L (87%), FEV1/FVC 63%. Lung volumes nad DLCO normal  INTERVAL HISTORY: No major events  SUBJ: She is now using Symbicort regularly and albuterol before exercise. She is still frustrated with DOE which she believes to be beyond what is expected for her given her longstanding devotion to exercise. She is her to review results of recent PFTs. She denies fever, chest pain, cough, sputum production, lower extremity edema, calf tenderness, orthopnea, paroxysmal nocturnal dyspnea.  OBJ: Vitals:   06/21/17 1419 06/21/17 1420  BP:  120/70  Pulse:  77  SpO2:  99%  Weight: 54 kg (119 lb)   Height: 4' 10.7" (1.491 m)   RA  Gen: NAD HEENT: All WNL Neck: NO LAN, no JVD noted Lungs: full BS, no wheezes or other adventitious sounds Cardiovascular: Reg, no M Abdomen: Soft, NT +BS Ext: no C/C/E Neuro: grossly intact  DATA: No new CXR PFTs as above  IMPRESSION: 1) Very mild COPD/chronic obstructive asthma - she has never smoked  2) DOE out of proportion to any objective findings    PLAN: We discussed the findings on PFTs and compared them to prior studies  I explained that I do not have a good explanation for her DOE but believe that we have ruled out uncontrolled asthma and other major illnesses.   Continue Singulair, Symbicort, albuterol as needed  Follow-up in 6 months or sooner as needed  Merton Border, MD PCCM service Mobile [phone] Pager [phone] 06/24/2017

## 2017-06-26 ENCOUNTER — Encounter: Payer: Self-pay | Admitting: Family

## 2017-06-29 ENCOUNTER — Ambulatory Visit (INDEPENDENT_AMBULATORY_CARE_PROVIDER_SITE_OTHER): Payer: Medicare Other | Admitting: Family

## 2017-06-29 ENCOUNTER — Encounter: Payer: Self-pay | Admitting: Family

## 2017-06-29 DIAGNOSIS — Z8041 Family history of malignant neoplasm of ovary: Secondary | ICD-10-CM | POA: Diagnosis not present

## 2017-06-29 DIAGNOSIS — N951 Menopausal and female climacteric states: Secondary | ICD-10-CM | POA: Diagnosis not present

## 2017-06-29 DIAGNOSIS — K219 Gastro-esophageal reflux disease without esophagitis: Secondary | ICD-10-CM | POA: Diagnosis not present

## 2017-06-29 NOTE — Progress Notes (Signed)
Subjective:    Patient ID: Natalie [name], female    DOB: [birth year], 71 y.o.   MRN: [id number]  CC: Natalie [name] is a 71 y.o. female who presents today for follow up.   HPI: Feeling well today  HRT- hasn't decreased estrace   FH of ovarian cancer-would like to continue discussing. Declines CT abdomen or CA 125   GERD- well controlled.   Simonds 7/9- DOE-Started on symbicort.  Follows with Dr Claiborne Billings, cardiology. Started on lipitor, BB, and amlodipine 10/2016 after cardiac cath.   HISTORY:  Past Medical History:  Diagnosis Date  . Allergy   . Chest pain   . Colon polyps   . Gastric ulcer   . GERD (gastroesophageal reflux disease)   . Nasal polyps    followed by Dr Tami Ribas   Past Surgical History:  Procedure Laterality Date  . ABDOMINAL HYSTERECTOMY  1984   due to fibroids; have ovaries.   . APPENDECTOMY  1952  . CARDIAC CATHETERIZATION N/A 10/22/2016   Procedure: Right/Left Heart Cath and Coronary Angiography;  Surgeon: Troy Sine, MD;  Location: [place] [facility];  Service: Cardiovascular;  Laterality: N/A;  . COLONOSCOPY WITH PROPOFOL N/A 03/03/2017   Procedure: COLONOSCOPY WITH PROPOFOL;  Surgeon: Manya Silvas, MD;  Location: [facility] END[facility];  Service: Endoscopy;  Laterality: N/A;  . HEMORROIDECTOMY  1995  . NASAL SINUS SURGERY  1988, 2008  . TONSILLECTOMY  1950  . VAGINAL DELIVERY     x 3   Family History  Problem Relation Age of Onset  . Heart disease Mother 60  . COPD Father 42  . Heart disease Brother 76       CABG at 20, Still living  . Cancer Daughter 17       Ovary  . Heart disease Maternal Grandmother   . Heart disease Maternal Grandfather   . Cancer Paternal Grandmother        Ovary - 66's and again in 33's  . COPD Paternal Grandfather   . Pneumonia Sister 1  . COPD Sister   . Breast cancer Neg Hx     Allergies: Aspirin; Amoxicillin-pot clavulanate; Clarithromycin; Codeine; Soma [carisoprodol]; and  Tramadol Current Outpatient Prescriptions on File Prior to Visit  Medication Sig Dispense Refill  . albuterol (PROVENTIL HFA;VENTOLIN HFA) 108 (90 Base) MCG/ACT inhaler Prior to exhertion 1 Inhaler 0  . amLODipine (NORVASC) 2.5 MG tablet Take 1 tablet (2.5 mg total) by mouth daily. 30 tablet 12  . aspirin EC 81 MG tablet Take 81 mg by mouth at bedtime.     Marland Kitchen atorvastatin (LIPITOR) 40 MG tablet Take 1 tablet (40 mg total) by mouth daily at 6 PM. 30 tablet 12  . baclofen (LIORESAL) 10 MG tablet Take 10 mg by mouth at bedtime.     Marland Kitchen EPIPEN 2-PAK 0.3 MG/0.3ML SOAJ injection Inject 0.3 mLs into the skin as needed (allergic reactions).   1  . estradiol (ESTRACE) 0.5 MG tablet Take 1 tablet (0.5 mg total) by mouth daily. 60 tablet 1  . fexofenadine (ALLEGRA) 180 MG tablet Take 180 mg by mouth daily.    . metoprolol tartrate (LOPRESSOR) 25 MG tablet Take 0.5 tablets (12.5 mg total) by mouth 2 (two) times daily. 30 tablet 12  . Probiotic Product (DIGESTIVE ADVANTAGE PO) Take 50,000 Units by mouth 2 (two) times daily at 8 am and 10 pm.      No current facility-administered medications on file prior to visit.  Social History  Substance Use Topics  . Smoking status: Never Smoker  . Smokeless tobacco: Never Used  . Alcohol use No    Review of Systems  Constitutional: Negative for chills and fever.  Respiratory: Positive for shortness of breath. Negative for cough and wheezing.   Cardiovascular: Negative for chest pain and palpitations.  Gastrointestinal: Negative for abdominal distention, abdominal pain, nausea and vomiting.  Genitourinary: Negative for vaginal bleeding.      Objective:    BP 124/72 (BP Location: Left Arm, Patient Position: Sitting, Cuff Size: Normal)   Pulse 65   Temp 98.6 F (37 C) (Oral)   Resp 12   Ht 4\' 11"  (1.499 m)   Wt 120 lb (54.4 kg)   SpO2 98%   BMI 24.24 kg/m  BP Readings from Last 3 Encounters:  06/29/17 124/72  06/21/17 120/70  05/11/17 (!) 112/58    Wt Readings from Last 3 Encounters:  06/29/17 120 lb (54.4 kg)  06/21/17 119 lb (54 kg)  05/11/17 117 lb (53.1 kg)    Physical Exam  Constitutional: She appears well-developed and well-nourished.  Eyes: Conjunctivae are normal.  Cardiovascular: Normal rate, regular rhythm, normal heart sounds and normal pulses.   Pulmonary/Chest: Effort normal and breath sounds normal. She has no wheezes. She has no rhonchi. She has no rales.  Neurological: She is alert.  Skin: Skin is warm and dry.  Psychiatric: She has a normal mood and affect. Her speech is normal and behavior is normal. Thought content normal.  Vitals reviewed.      Assessment & Plan:   Problem List Items Addressed This Visit      Digestive   GERD (gastroesophageal reflux disease)    Takes baclofen with relief. Decreased of late. Will follow.         Other   Menopausal hot flushes    Patient aware risk factors of taking exogenous estrogen. Patient will try again to decrease Estrace dose.      Family history of ovarian cancer    Patient decided at this time she will decline any imaging or labs. We will continue discuss at future visits.          I am having Ms. Natalie [name] maintain her aspirin EC, baclofen, EPIPEN 2-PAK, fexofenadine, Probiotic Product (DIGESTIVE ADVANTAGE PO), amLODipine, atorvastatin, metoprolol tartrate, estradiol, albuterol, and budesonide-formoterol.   Meds ordered this encounter  Medications  . budesonide-formoterol (SYMBICORT) 80-4.5 MCG/ACT inhaler    Sig: Inhale 2 puffs into the lungs 2 (two) times daily.    Return precautions given.   Risks, benefits, and alternatives of the medications and treatment plan prescribed today were discussed, and patient expressed understanding.   Education regarding symptom management and diagnosis given to patient on AVS.  Continue to follow with Burnard Hawthorne, FNP for routine health maintenance.   Natalie [name] and I agreed with  plan.   Mable Paris, FNP

## 2017-06-29 NOTE — Assessment & Plan Note (Signed)
Patient decided at this time she will decline any imaging or labs. We will continue discuss at future visits.

## 2017-06-29 NOTE — Assessment & Plan Note (Signed)
Takes baclofen with relief. Decreased of late. Will follow.

## 2017-06-29 NOTE — Patient Instructions (Addendum)
Lets continue to discuss sreening for ovarian cancer.   If you find you can decrease Estrace days, all the better  Pleasure seeing you as always

## 2017-06-29 NOTE — Assessment & Plan Note (Signed)
Patient aware risk factors of taking exogenous estrogen. Patient will try again to decrease Estrace dose.

## 2017-06-29 NOTE — Progress Notes (Signed)
Pre-visit discussion using our clinic review tool. No additional management support is needed unless otherwise documented below in the visit note.  

## 2017-07-01 DIAGNOSIS — J310 Chronic rhinitis: Secondary | ICD-10-CM | POA: Diagnosis not present

## 2017-07-01 DIAGNOSIS — J301 Allergic rhinitis due to pollen: Secondary | ICD-10-CM | POA: Diagnosis not present

## 2017-07-01 DIAGNOSIS — J339 Nasal polyp, unspecified: Secondary | ICD-10-CM | POA: Diagnosis not present

## 2017-07-07 DIAGNOSIS — J301 Allergic rhinitis due to pollen: Secondary | ICD-10-CM | POA: Diagnosis not present

## 2017-07-14 DIAGNOSIS — J301 Allergic rhinitis due to pollen: Secondary | ICD-10-CM | POA: Diagnosis not present

## 2017-07-16 ENCOUNTER — Other Ambulatory Visit: Payer: Self-pay | Admitting: Family

## 2017-07-16 DIAGNOSIS — N951 Menopausal and female climacteric states: Secondary | ICD-10-CM

## 2017-07-22 DIAGNOSIS — J301 Allergic rhinitis due to pollen: Secondary | ICD-10-CM | POA: Diagnosis not present

## 2017-07-28 DIAGNOSIS — J301 Allergic rhinitis due to pollen: Secondary | ICD-10-CM | POA: Diagnosis not present

## 2017-08-04 DIAGNOSIS — J301 Allergic rhinitis due to pollen: Secondary | ICD-10-CM | POA: Diagnosis not present

## 2017-08-11 DIAGNOSIS — J301 Allergic rhinitis due to pollen: Secondary | ICD-10-CM | POA: Diagnosis not present

## 2017-08-18 DIAGNOSIS — J301 Allergic rhinitis due to pollen: Secondary | ICD-10-CM | POA: Diagnosis not present

## 2017-08-25 DIAGNOSIS — J301 Allergic rhinitis due to pollen: Secondary | ICD-10-CM | POA: Diagnosis not present

## 2017-09-07 DIAGNOSIS — J301 Allergic rhinitis due to pollen: Secondary | ICD-10-CM | POA: Diagnosis not present

## 2017-09-09 ENCOUNTER — Other Ambulatory Visit: Payer: Self-pay | Admitting: Pulmonary Disease

## 2017-09-20 ENCOUNTER — Other Ambulatory Visit: Payer: Self-pay | Admitting: Family

## 2017-09-20 DIAGNOSIS — N951 Menopausal and female climacteric states: Secondary | ICD-10-CM

## 2017-09-22 DIAGNOSIS — J301 Allergic rhinitis due to pollen: Secondary | ICD-10-CM | POA: Diagnosis not present

## 2017-09-30 DIAGNOSIS — J301 Allergic rhinitis due to pollen: Secondary | ICD-10-CM | POA: Diagnosis not present

## 2017-10-14 DIAGNOSIS — J301 Allergic rhinitis due to pollen: Secondary | ICD-10-CM | POA: Diagnosis not present

## 2017-10-19 DIAGNOSIS — R143 Flatulence: Secondary | ICD-10-CM | POA: Diagnosis not present

## 2017-10-19 DIAGNOSIS — R1310 Dysphagia, unspecified: Secondary | ICD-10-CM | POA: Diagnosis not present

## 2017-10-19 DIAGNOSIS — R0609 Other forms of dyspnea: Secondary | ICD-10-CM | POA: Diagnosis not present

## 2017-10-20 DIAGNOSIS — J301 Allergic rhinitis due to pollen: Secondary | ICD-10-CM | POA: Diagnosis not present

## 2017-10-27 DIAGNOSIS — J301 Allergic rhinitis due to pollen: Secondary | ICD-10-CM | POA: Diagnosis not present

## 2017-11-10 DIAGNOSIS — J301 Allergic rhinitis due to pollen: Secondary | ICD-10-CM | POA: Diagnosis not present

## 2017-11-12 ENCOUNTER — Telehealth: Payer: Self-pay | Admitting: *Deleted

## 2017-11-12 ENCOUNTER — Other Ambulatory Visit: Payer: Self-pay

## 2017-11-12 MED ORDER — METOPROLOL TARTRATE 25 MG PO TABS
12.5000 mg | ORAL_TABLET | Freq: Two times a day (BID) | ORAL | 0 refills | Status: DC
Start: 2017-11-12 — End: 2017-11-16

## 2017-11-12 MED ORDER — ATORVASTATIN CALCIUM 40 MG PO TABS: 40.0000 mg | ORAL_TABLET | Freq: Every day | ORAL | 0 refills | Status: DC

## 2017-11-12 MED ORDER — AMLODIPINE BESYLATE 2.5 MG PO TABS: 2.5000 mg | ORAL_TABLET | Freq: Every day | ORAL | 0 refills | Status: DC

## 2017-11-12 NOTE — Telephone Encounter (Signed)
Spoke with patient about medications needed for refill. I let her know that I sent them to her pharmacy requested. Patient thankful of call back.

## 2017-11-12 NOTE — Telephone Encounter (Signed)
Patient left a msg on the refill vm stating that [facility] requested refills on three of her meds last Wednesday, but they never received a response. They gave her an emergency refill yesterday. Patient can be reached at [phone]. No further information was left. Thanks, MI

## 2017-11-14 ENCOUNTER — Other Ambulatory Visit: Payer: Self-pay | Admitting: Family

## 2017-11-14 DIAGNOSIS — N951 Menopausal and female climacteric states: Secondary | ICD-10-CM

## 2017-11-16 ENCOUNTER — Ambulatory Visit (INDEPENDENT_AMBULATORY_CARE_PROVIDER_SITE_OTHER): Payer: Medicare Other | Admitting: Physician Assistant

## 2017-11-16 ENCOUNTER — Encounter: Payer: Self-pay | Admitting: Physician Assistant

## 2017-11-16 VITALS — BP 120/63 | HR 56 | Ht 59.0 in | Wt 115.8 lb

## 2017-11-16 DIAGNOSIS — I2510 Atherosclerotic heart disease of native coronary artery without angina pectoris: Secondary | ICD-10-CM | POA: Diagnosis not present

## 2017-11-16 DIAGNOSIS — I83813 Varicose veins of bilateral lower extremities with pain: Secondary | ICD-10-CM | POA: Diagnosis not present

## 2017-11-16 DIAGNOSIS — R0609 Other forms of dyspnea: Secondary | ICD-10-CM | POA: Diagnosis not present

## 2017-11-16 MED ORDER — METOPROLOL TARTRATE 25 MG PO TABS: 12.5000 mg | ORAL_TABLET | Freq: Two times a day (BID) | ORAL | 3 refills | Status: DC

## 2017-11-16 NOTE — Patient Instructions (Signed)
Medication Instructions:  NO CHANGES If you need a refill on your cardiac medications before your next appointment, please call your pharmacy.  Labwork: PLEASE MAKE SURE YOU PCP IS ORDERING YOUR CHOLESTEROL LABS  Follow-Up: Your physician wants you to follow-up in: 58 MONTHS WITH DR CROITORU. You should receive a reminder letter in the mail two months in advance. If you do not receive a letter, please call our office September 2019 to schedule the November 2019 follow-up appointment.   Thank you for choosing [facility] at [facility]!!

## 2017-11-16 NOTE — Progress Notes (Signed)
Cardiology Office Note   Date:  11/16/2017   ID:  Natalie [name], DOB 1946/02/23, MRN [id number]  PCP:  Burnard Hawthorne, FNP  Cardiologist: Dr. Sallyanne Kuster, 2014 Rosaria Ferries, PA-C 11/25/2016  CC: DOE  History of Present Illness: Natalie [name] is a 71 y.o. female with a history of  female with a history of COPD (now improved), allergies,esoph spasm w/ dilatation, sinusitis, FH CAD, no FHSCD, nl echo 2015, cath 10/2016 with no critical CAD and normal right heart pressures  Natalie [name] presents for cardiology follow up.   She is still having the DOE. She does not understand why she cannot keep her former activity level. She does not understand why it changed.  She has not had any chest pain.  Her dyspnea on exertion has not changed recently.  She has no problems with lower extremity edema.  She denies orthopnea or PND.  She is very careful with what she eats, and eats a low-sodium diet.  She has GI issues, problems with her GE sphincter and esophageal spasm. She is on baclofen, is down to 1/day. She could not go to 1/2 tab daily, had more symptoms.   No LE edema, but has LE pain, possibly from varicose veins. It starts just above the ankle on the R.  Sometimes, at night, that part of her leg is tender and she cannot lay on that side.  She had a procedure on her varicose veins years ago, but says they got worse afterwards.   Past Medical History:  Diagnosis Date  . Allergy   . Chest pain   . Colon polyps   . Gastric ulcer   . GERD (gastroesophageal reflux disease)   . Nasal polyps    followed by Dr Tami Ribas    Past Surgical History:  Procedure Laterality Date  . ABDOMINAL HYSTERECTOMY  1984   due to fibroids; have ovaries.   . APPENDECTOMY  1952  . CARDIAC CATHETERIZATION N/A 10/22/2016   Procedure: Right/Left Heart Cath and Coronary Angiography;  Surgeon: Troy Sine, MD;  Location: [place] [facility];  Service: Cardiovascular;   Laterality: N/A;  . COLONOSCOPY WITH PROPOFOL N/A 03/03/2017   Procedure: COLONOSCOPY WITH PROPOFOL;  Surgeon: Manya Silvas, MD;  Location[facility] END[facility];  Service: Endoscopy;  Laterality: N/A;  . HEMORROIDECTOMY  1995  . NASAL SINUS SURGERY  1988, 2008  . TONSILLECTOMY  1950  . VAGINAL DELIVERY     x 3    Current Outpatient Medications  Medication Sig Dispense Refill  . amLODipine (NORVASC) 2.5 MG tablet Take 1 tablet (2.5 mg total) by mouth daily. Please call [phone] to schedule appointment for additional refills. 90 tablet 0  . aspirin EC 81 MG tablet Take 81 mg by mouth at bedtime.     Marland Kitchen atorvastatin (LIPITOR) 40 MG tablet Take 1 tablet (40 mg total) by mouth daily at 6 PM. Please call [phone] to schedule appointment for additional refills. 90 tablet 0  . baclofen (LIORESAL) 10 MG tablet Take 10 mg by mouth at bedtime.     . budesonide-formoterol (SYMBICORT) 80-4.5 MCG/ACT inhaler Inhale 2 puffs into the lungs 2 (two) times daily.    Marland Kitchen EPIPEN 2-PAK 0.3 MG/0.3ML SOAJ injection Inject 0.3 mLs into the skin as needed (allergic reactions).   1  . estradiol (ESTRACE) 0.5 MG tablet TAKE 1 TABLET(0.5 MG) BY MOUTH DAILY 60 tablet 0  . fexofenadine (ALLEGRA) 180 MG tablet Take 180 mg by mouth daily.    Marland Kitchen  metoprolol tartrate (LOPRESSOR) 25 MG tablet Take 0.5 tablets (12.5 mg total) by mouth 2 (two) times daily. Please call [phone] to schedule appointment for additional refills. 90 tablet 0  . PROAIR HFA 108 (90 Base) MCG/ACT inhaler USE AS DIRECTED PRIOR TO EXHERTION 8.5 g 0  . Probiotic Product (DIGESTIVE ADVANTAGE PO) Take 50,000 Units by mouth 2 (two) times daily at 8 am and 10 pm.      No current facility-administered medications for this visit.     Allergies:   Aspirin; Amoxicillin-pot clavulanate; Clarithromycin; Codeine; Soma [carisoprodol]; and Tramadol    Social History:  The patient  reports that  has never smoked. she has never used smokeless tobacco. She reports  that she does not drink alcohol or use drugs.   Family History:  The patient's family history includes COPD in her paternal grandfather and sister; COPD (age of onset: 12) in her father; Cancer in her paternal grandmother; Cancer (age of onset: 75) in her daughter; Heart disease in her maternal grandfather and maternal grandmother; Heart disease (age of onset: 42) in her brother; Heart disease (age of onset: 1) in her mother; Pneumonia (age of onset: 70) in her sister.    ROS:  Please see the history of present illness. All other systems are reviewed and negative.    PHYSICAL EXAM: VS:  BP 120/63   Pulse (!) 56   Ht 4\' 11"  (1.499 m)   Wt 115 lb 12.8 oz (52.5 kg)   SpO2 97%   BMI 23.39 kg/m  , BMI Body mass index is 23.39 kg/m. GEN: Well nourished, well developed, female in no acute distress  HEENT: normal for age  Neck: no JVD, no carotid bruit, no masses Cardiac: RRR; no murmur, no rubs, or gallops Respiratory:  clear to auscultation bilaterally, normal work of breathing GI: soft, nontender, nondistended, + BS MS: no deformity or atrophy; no edema; distal pulses are 2+ in all 4 extremities; varicose veins are noted on the outer aspect of her right lower leg.  She also has them on her left thigh.  They were a little tender, but there is no edema and no signs of phlebitis or infection. Skin: warm and dry, no rash Neuro:  Strength and sensation are intact Psych: euthymic mood, full affect   EKG:  EKG is not ordered today.  CATH: 10/22/2016  The left ventricular systolic function is normal.  LV end diastolic pressure is normal.  Ost 1st Diag to 1st Diag lesion, 45 %stenosed.  Normal right heart pressures. Single vessel CAD with mild calcification of the LAD at its ostium without significant stenosis and 40-50% eccentric stenosis involving the ostium/proximal portion of the first diagonal vessel RECOMMENDATION: Medical therapy.  The patient will be started on statin therapy,  low-dose beta blocker and amlodipine.  She will follow-up with Dr. Sallyanne Kuster.   Recent Labs: No results found for requested labs within last 8760 hours.    Lipid Panel    Component Value Date/Time   CHOL 214 (H) 03/10/2016 1019   TRIG 70.0 03/10/2016 1019   HDL 81.80 03/10/2016 1019   CHOLHDL 3 03/10/2016 1019   VLDL 14.0 03/10/2016 1019   LDLCALC 118 (H) 03/10/2016 1019     Wt Readings from Last 3 Encounters:  11/16/17 115 lb 12.8 oz (52.5 kg)  06/29/17 120 lb (54.4 kg)  06/21/17 119 lb (54 kg)     Other studies Reviewed: Additional studies/ records that were reviewed today include: Office notes, hospital records and  testing.  ASSESSMENT AND PLAN:  1.  Dyspnea on exertion: Greater than 30 minutes spent with patient reviewing prior testing and its results.  I explained that although we could not explain her dyspnea on exertion, she has had an extensive workup to make sure that there is no life-threatening cause.  I explained that a CPX test would provide more information, but I did not think it would change treatment.  She wishes to defer this for now.  I explained that she did not have any drop in her oxygen saturation with exertion.    I feel she is safe to exert herself as she wishes.  I encouraged her to limit her exertion based on her dyspnea.  I reviewed with her what Dr. 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  If she kept the 3 more strenuous classes and stopped the less exertional classes, she might feel better.  2.  Nonobstructive CAD: I explained that she had minor coronary artery disease at her cath.  I explained that she has a heart healthy lifestyle and I feel confident that she is doing everything she can to keep this from progressing.  Continue baby aspirin.  Goal LDL is less than 70.  3.  Varicose veins with pain: For right now, she does not wish to do anything.  If she wishes to get a  referral to [facility] to see if there is anything that can be done, we will be happy to make this.   Current medicines are reviewed at length with the patient today.  The patient does not have concerns regarding medicines.  The following changes have been made:  no change  Labs/ tests ordered today include:  No orders of the defined types were placed in this encounter.    Disposition:   FU with Dr. Sallyanne Kuster   Signed, Rosaria Ferries, PA-C  11/16/2017 9:40 AM    [place] Phone: [phone]; Fax: [phone]  This note was written with the assistance of speech recognition software. Please excuse any transcriptional errors.

## 2017-11-17 DIAGNOSIS — R0982 Postnasal drip: Secondary | ICD-10-CM | POA: Diagnosis not present

## 2017-11-17 DIAGNOSIS — J301 Allergic rhinitis due to pollen: Secondary | ICD-10-CM | POA: Diagnosis not present

## 2017-11-17 DIAGNOSIS — J324 Chronic pansinusitis: Secondary | ICD-10-CM | POA: Diagnosis not present

## 2017-11-17 DIAGNOSIS — J309 Allergic rhinitis, unspecified: Secondary | ICD-10-CM | POA: Diagnosis not present

## 2017-11-29 DIAGNOSIS — H401131 Primary open-angle glaucoma, bilateral, mild stage: Secondary | ICD-10-CM | POA: Diagnosis not present

## 2017-12-01 DIAGNOSIS — J301 Allergic rhinitis due to pollen: Secondary | ICD-10-CM | POA: Diagnosis not present

## 2017-12-03 DIAGNOSIS — J301 Allergic rhinitis due to pollen: Secondary | ICD-10-CM | POA: Diagnosis not present

## 2017-12-09 DIAGNOSIS — J339 Nasal polyp, unspecified: Secondary | ICD-10-CM | POA: Diagnosis not present

## 2017-12-09 DIAGNOSIS — J324 Chronic pansinusitis: Secondary | ICD-10-CM | POA: Diagnosis not present

## 2017-12-09 DIAGNOSIS — J301 Allergic rhinitis due to pollen: Secondary | ICD-10-CM | POA: Diagnosis not present

## 2017-12-22 DIAGNOSIS — J301 Allergic rhinitis due to pollen: Secondary | ICD-10-CM | POA: Diagnosis not present

## 2017-12-29 DIAGNOSIS — J301 Allergic rhinitis due to pollen: Secondary | ICD-10-CM | POA: Diagnosis not present

## 2018-01-05 DIAGNOSIS — J301 Allergic rhinitis due to pollen: Secondary | ICD-10-CM | POA: Diagnosis not present

## 2018-01-17 ENCOUNTER — Other Ambulatory Visit: Payer: Self-pay | Admitting: Family

## 2018-01-17 DIAGNOSIS — N951 Menopausal and female climacteric states: Secondary | ICD-10-CM

## 2018-01-17 DIAGNOSIS — L501 Idiopathic urticaria: Secondary | ICD-10-CM | POA: Diagnosis not present

## 2018-01-24 ENCOUNTER — Ambulatory Visit: Payer: Medicare Other

## 2018-02-03 DIAGNOSIS — J301 Allergic rhinitis due to pollen: Secondary | ICD-10-CM | POA: Diagnosis not present

## 2018-02-07 ENCOUNTER — Ambulatory Visit: Payer: Medicare Other

## 2018-02-08 ENCOUNTER — Other Ambulatory Visit: Payer: Self-pay | Admitting: Cardiovascular Disease

## 2018-02-08 NOTE — Telephone Encounter (Signed)
REFILL 

## 2018-02-09 DIAGNOSIS — J301 Allergic rhinitis due to pollen: Secondary | ICD-10-CM | POA: Diagnosis not present

## 2018-02-16 DIAGNOSIS — J301 Allergic rhinitis due to pollen: Secondary | ICD-10-CM | POA: Diagnosis not present

## 2018-02-18 DIAGNOSIS — J301 Allergic rhinitis due to pollen: Secondary | ICD-10-CM | POA: Diagnosis not present

## 2018-02-24 ENCOUNTER — Ambulatory Visit: Payer: Medicare Other

## 2018-03-03 DIAGNOSIS — J301 Allergic rhinitis due to pollen: Secondary | ICD-10-CM | POA: Diagnosis not present

## 2018-03-04 ENCOUNTER — Ambulatory Visit: Payer: Medicare Other

## 2018-03-04 ENCOUNTER — Ambulatory Visit: Payer: Medicare Other | Admitting: Family

## 2018-03-09 DIAGNOSIS — J301 Allergic rhinitis due to pollen: Secondary | ICD-10-CM | POA: Diagnosis not present

## 2018-03-22 ENCOUNTER — Other Ambulatory Visit: Payer: Self-pay | Admitting: Family

## 2018-03-22 DIAGNOSIS — N951 Menopausal and female climacteric states: Secondary | ICD-10-CM

## 2018-03-23 DIAGNOSIS — J301 Allergic rhinitis due to pollen: Secondary | ICD-10-CM | POA: Diagnosis not present

## 2018-03-23 NOTE — Telephone Encounter (Signed)
Last office visit 11/14/17 No office visit scheduled  Last office visit 06/29/17, you asked patient to decrease

## 2018-03-23 NOTE — Telephone Encounter (Signed)
I have refilled the Estrace.  Please make a follow-up appointment for patient as she is not been seen in almost a year.

## 2018-03-24 ENCOUNTER — Other Ambulatory Visit: Payer: Self-pay | Admitting: Pulmonary Disease

## 2018-04-05 NOTE — Telephone Encounter (Signed)
lmtc , my chart message sent.

## 2018-04-06 DIAGNOSIS — J301 Allergic rhinitis due to pollen: Secondary | ICD-10-CM | POA: Diagnosis not present

## 2018-04-13 DIAGNOSIS — J301 Allergic rhinitis due to pollen: Secondary | ICD-10-CM | POA: Diagnosis not present

## 2018-04-20 DIAGNOSIS — J301 Allergic rhinitis due to pollen: Secondary | ICD-10-CM | POA: Diagnosis not present

## 2018-04-28 ENCOUNTER — Other Ambulatory Visit: Payer: Self-pay | Admitting: Internal Medicine

## 2018-04-28 DIAGNOSIS — Z1231 Encounter for screening mammogram for malignant neoplasm of breast: Secondary | ICD-10-CM | POA: Diagnosis not present

## 2018-04-28 DIAGNOSIS — I2510 Atherosclerotic heart disease of native coronary artery without angina pectoris: Secondary | ICD-10-CM | POA: Diagnosis not present

## 2018-04-28 DIAGNOSIS — J339 Nasal polyp, unspecified: Secondary | ICD-10-CM | POA: Diagnosis not present

## 2018-04-28 DIAGNOSIS — I10 Essential (primary) hypertension: Secondary | ICD-10-CM | POA: Diagnosis not present

## 2018-04-28 DIAGNOSIS — J3089 Other allergic rhinitis: Secondary | ICD-10-CM | POA: Diagnosis not present

## 2018-04-28 DIAGNOSIS — Z0000 Encounter for general adult medical examination without abnormal findings: Secondary | ICD-10-CM | POA: Diagnosis not present

## 2018-04-28 DIAGNOSIS — E7800 Pure hypercholesterolemia, unspecified: Secondary | ICD-10-CM | POA: Diagnosis not present

## 2018-04-28 DIAGNOSIS — M8589 Other specified disorders of bone density and structure, multiple sites: Secondary | ICD-10-CM | POA: Diagnosis not present

## 2018-04-28 DIAGNOSIS — Z23 Encounter for immunization: Secondary | ICD-10-CM | POA: Diagnosis not present

## 2018-04-28 DIAGNOSIS — J301 Allergic rhinitis due to pollen: Secondary | ICD-10-CM | POA: Diagnosis not present

## 2018-04-28 DIAGNOSIS — Z1239 Encounter for other screening for malignant neoplasm of breast: Secondary | ICD-10-CM

## 2018-04-28 DIAGNOSIS — N951 Menopausal and female climacteric states: Secondary | ICD-10-CM | POA: Diagnosis not present

## 2018-05-04 DIAGNOSIS — J301 Allergic rhinitis due to pollen: Secondary | ICD-10-CM | POA: Diagnosis not present

## 2018-05-05 ENCOUNTER — Ambulatory Visit (INDEPENDENT_AMBULATORY_CARE_PROVIDER_SITE_OTHER): Payer: Medicare Other | Admitting: Pulmonary Disease

## 2018-05-05 ENCOUNTER — Encounter: Payer: Self-pay | Admitting: Pulmonary Disease

## 2018-05-05 VITALS — BP 116/60 | HR 68 | Ht 59.0 in | Wt 118.0 lb

## 2018-05-05 DIAGNOSIS — R0609 Other forms of dyspnea: Secondary | ICD-10-CM

## 2018-05-05 DIAGNOSIS — J449 Chronic obstructive pulmonary disease, unspecified: Secondary | ICD-10-CM

## 2018-05-05 NOTE — Patient Instructions (Signed)
Trial of Bevespi inhaler (sample provided)  2 inhalations twice per day  During this trial, do not use Symbicort  Continue albuterol inhaler as needed and before exercise  During Bevespi trial, pay attention to level of shortness of breath especially during exercise  If you feel that Natalie [name] is an improvement over Symbicort, let us know and I will use the prescription for this  Follow-up in 3 months with repeat lung function tests (PFTs) prior to that visit

## 2018-05-06 NOTE — Progress Notes (Signed)
PULMONARY OFFICE FOLLOW UP NOTE   PT ID: 72 y.o. F never smoker previously followed by Dr Stevenson Clinch with diagnosis of mild COPD  PROBLEMS:  Mild COPD  Exertional dyspnea out of proportion to Spirometry findings Single vessel CAD - medical Rx  DATA: PFTs 11/20/14: mild obstruction, FEV1 1.47 liters, 88% predicted Spirometry 12/03/16: Mild obstruction, FEV1 1.57 liters, 88% predicted PFTs 06/08/17: mild obstruction. FEV1 1.69 L (87%), FEV1/FVC 63%. Lung volumes nad DLCO normal  INTERVAL HISTORY: Last seen 06/21/17. No major events  SUBJ: This is a scheduled ROV. She remains on Symbicort and is using it compliantly. She uses albuterol MDI prior to exercise. She is trying to remain active and exercises regularly. However, she continues to complain of DOE which she believes is worse than it should be. Overall, this problem has not worsened. She denies CP, fever, purulent sputum, hemoptysis, LE edema and calf tenderness.  OBJ: Vitals:   05/05/18 1011 05/05/18 1018  BP:  116/60  Pulse:  68  SpO2:  96%  Weight: 118 lb (53.5 kg)   Height: 4\' 11"  (1.499 m)   RA  Gen: NAD HEENT: NCAT, sclera white Neck: No JVD Lungs: breath sounds full, no wheezes or other adventitious sounds Cardiovascular: RRR, no murmurs Abdomen: Soft, nontender, normal BS Ext: without clubbing, cyanosis, edema Neuro: grossly intact Skin: Limited exam, no lesions noted   DATA: BMP Latest Ref Rng & Units 10/20/2016 03/10/2016 10/01/2014  Glucose 65 - 99 mg/dL 100(H) 92 96  BUN 7 - 25 mg/dL 10 13 11   Creatinine 0.60 - 0.93 mg/dL 0.60 0.67 0.6  Sodium 135 - 146 mmol/L 140 141 142  Potassium 3.5 - 5.3 mmol/L 5.2 4.6 4.6  Chloride 98 - 110 mmol/L 103 106 104  CO2 20 - 31 mmol/L 26 29 24   Calcium 8.6 - 10.4 mg/dL 9.6 9.2 9.4    CBC Latest Ref Rng & Units 10/20/2016 03/10/2016 10/01/2014  WBC 3.8 - 10.8 K/uL 8.9 6.9 9.5  Hemoglobin 11.7 - 15.5 g/dL 12.5 12.6 12.4  Hematocrit 35.0 - 45.0 % 37.9 37.7 37.7  Platelets  140 - 400 K/uL 483(H) 320.0 402.0(H)    CXR: no new film   IMPRESSION: 1) Very mild COPD/chronic obstructive asthma - never smoker 2) DOE out of proportion to any objective findings    PLAN: Trial of Bevespi inhaler (sample provided)  2 inhalations twice per day  During this trial, she is not to use Symbicort  Continue albuterol inhaler as needed and before exercise  During Bevespi trial, she is to pay attention to level of shortness of breath especially during exercise  If she feels that Charolotte Eke is an improvement over Symbicort, she is to let us know and I will enter the prescription for this  Follow-up in 3 months with repeat lung function tests (PFTs) prior to that visit  Merton Border, MD PCCM service Mobile [phone] Pager [phone] 05/06/2018 11:43 AM

## 2018-05-11 DIAGNOSIS — J301 Allergic rhinitis due to pollen: Secondary | ICD-10-CM | POA: Diagnosis not present

## 2018-05-12 DIAGNOSIS — M8589 Other specified disorders of bone density and structure, multiple sites: Secondary | ICD-10-CM | POA: Diagnosis not present

## 2018-05-18 DIAGNOSIS — J301 Allergic rhinitis due to pollen: Secondary | ICD-10-CM | POA: Diagnosis not present

## 2018-05-20 DIAGNOSIS — J301 Allergic rhinitis due to pollen: Secondary | ICD-10-CM | POA: Diagnosis not present

## 2018-05-21 ENCOUNTER — Other Ambulatory Visit: Payer: Self-pay | Admitting: Family

## 2018-05-21 DIAGNOSIS — N951 Menopausal and female climacteric states: Secondary | ICD-10-CM

## 2018-05-23 NOTE — Telephone Encounter (Signed)
Last filled 05/19/18 Last office visit 06/29/17

## 2018-05-25 DIAGNOSIS — J301 Allergic rhinitis due to pollen: Secondary | ICD-10-CM | POA: Diagnosis not present

## 2018-06-02 ENCOUNTER — Ambulatory Visit
Admission: RE | Admit: 2018-06-02 | Discharge: 2018-06-02 | Disposition: A | Discharge status: Home / Self Care | Payer: Medicare Other | Source: Ambulatory Visit | Attending: Internal Medicine | Admitting: Internal Medicine

## 2018-06-02 DIAGNOSIS — Z1239 Encounter for other screening for malignant neoplasm of breast: Secondary | ICD-10-CM

## 2018-06-02 DIAGNOSIS — Z1231 Encounter for screening mammogram for malignant neoplasm of breast: Secondary | ICD-10-CM | POA: Insufficient documentation

## 2018-06-02 DIAGNOSIS — J301 Allergic rhinitis due to pollen: Secondary | ICD-10-CM | POA: Diagnosis not present

## 2018-06-07 DIAGNOSIS — J339 Nasal polyp, unspecified: Secondary | ICD-10-CM | POA: Diagnosis not present

## 2018-06-07 DIAGNOSIS — J309 Allergic rhinitis, unspecified: Secondary | ICD-10-CM | POA: Diagnosis not present

## 2018-06-08 DIAGNOSIS — J301 Allergic rhinitis due to pollen: Secondary | ICD-10-CM | POA: Diagnosis not present

## 2018-06-14 ENCOUNTER — Telehealth: Payer: Self-pay | Admitting: Cardiovascular Disease

## 2018-06-14 DIAGNOSIS — E785 Hyperlipidemia, unspecified: Secondary | ICD-10-CM

## 2018-06-14 DIAGNOSIS — I2510 Atherosclerotic heart disease of native coronary artery without angina pectoris: Secondary | ICD-10-CM

## 2018-06-14 NOTE — Telephone Encounter (Signed)
Returned call to patient of Dr. Loletha Grayer. She complains of the forearm muscle feeling like it is twisting and it is also in her legs. She attributed this to lipitor. She stopped lipitor and her symptoms resolved and then her symptoms came back again after she resumed. She has been off lipitor for a few days now. She has had the symptoms for "quite a while"  Per chart review, last lipid panel was 02/2016. She has lipid panel in care everywhere from May 2019 and she states she was on statin then.   She would like MD recommendations as to what to do with her medication

## 2018-06-14 NOTE — Telephone Encounter (Signed)
Pt calling   Pt c/o medication issue:  1. Name of Medication: pt stated her cholestrol medication. Pt didn't know the name of medication  2. How are you currently taking this medication (dosage and times per day)? 40mg  once daily  3. Are you having a reaction (difficulty breathing--STAT)? NO  4. What is your medication issue? Making her arms and legs twisting

## 2018-06-15 DIAGNOSIS — J301 Allergic rhinitis due to pollen: Secondary | ICD-10-CM | POA: Diagnosis not present

## 2018-06-15 MED ORDER — ROSUVASTATIN CALCIUM 10 MG PO TABS: 10.0000 mg | ORAL_TABLET | Freq: Every day | ORAL | 3 refills | Status: DC

## 2018-06-15 NOTE — Telephone Encounter (Signed)
The lipid profile in May was great. I would wait untril all her muscular complaints have completely resolved and then would try rosuvastatin 10 mg daily with a repeat lipid profile after 3 months. MCr

## 2018-06-15 NOTE — Telephone Encounter (Signed)
Patient called w/MD recommendations. She agrees w/plan. Rx(s) sent to pharmacy electronically. Lab order mailed

## 2018-06-22 DIAGNOSIS — J339 Nasal polyp, unspecified: Secondary | ICD-10-CM | POA: Diagnosis not present

## 2018-06-22 DIAGNOSIS — J300 Vasomotor rhinitis: Secondary | ICD-10-CM | POA: Diagnosis not present

## 2018-06-30 DIAGNOSIS — J301 Allergic rhinitis due to pollen: Secondary | ICD-10-CM | POA: Diagnosis not present

## 2018-07-06 DIAGNOSIS — J301 Allergic rhinitis due to pollen: Secondary | ICD-10-CM | POA: Diagnosis not present

## 2018-07-13 DIAGNOSIS — J301 Allergic rhinitis due to pollen: Secondary | ICD-10-CM | POA: Diagnosis not present

## 2018-07-27 DIAGNOSIS — J301 Allergic rhinitis due to pollen: Secondary | ICD-10-CM | POA: Diagnosis not present

## 2018-08-03 DIAGNOSIS — J301 Allergic rhinitis due to pollen: Secondary | ICD-10-CM | POA: Diagnosis not present

## 2018-08-04 ENCOUNTER — Ambulatory Visit: Payer: Medicare Other | Attending: Pulmonary Disease

## 2018-08-04 DIAGNOSIS — R0609 Other forms of dyspnea: Secondary | ICD-10-CM | POA: Diagnosis not present

## 2018-08-04 DIAGNOSIS — J449 Chronic obstructive pulmonary disease, unspecified: Secondary | ICD-10-CM | POA: Diagnosis not present

## 2018-08-04 MED ORDER — ALBUTEROL SULFATE (2.5 MG/3ML) 0.083% IN NEBU
2.5000 mg | INHALATION_SOLUTION | Freq: Once | RESPIRATORY_TRACT | Status: AC
  Administered 2018-08-04: 2.5 mg via RESPIRATORY_TRACT
  Filled 2018-08-04: qty 3

## 2018-08-09 ENCOUNTER — Other Ambulatory Visit: Payer: Self-pay | Admitting: Student

## 2018-08-09 DIAGNOSIS — R1310 Dysphagia, unspecified: Secondary | ICD-10-CM

## 2018-08-09 DIAGNOSIS — H401131 Primary open-angle glaucoma, bilateral, mild stage: Secondary | ICD-10-CM | POA: Diagnosis not present

## 2018-08-10 ENCOUNTER — Ambulatory Visit
Admission: RE | Admit: 2018-08-10 | Discharge: 2018-08-10 | Disposition: A | Discharge status: Home / Self Care | Payer: Medicare Other | Source: Ambulatory Visit | Attending: Student | Admitting: Student

## 2018-08-10 DIAGNOSIS — K219 Gastro-esophageal reflux disease without esophagitis: Secondary | ICD-10-CM | POA: Diagnosis not present

## 2018-08-10 DIAGNOSIS — R0609 Other forms of dyspnea: Secondary | ICD-10-CM | POA: Diagnosis not present

## 2018-08-10 DIAGNOSIS — K224 Dyskinesia of esophagus: Secondary | ICD-10-CM | POA: Diagnosis not present

## 2018-08-10 DIAGNOSIS — J301 Allergic rhinitis due to pollen: Secondary | ICD-10-CM | POA: Diagnosis not present

## 2018-08-10 DIAGNOSIS — K222 Esophageal obstruction: Secondary | ICD-10-CM | POA: Diagnosis not present

## 2018-08-10 DIAGNOSIS — R1310 Dysphagia, unspecified: Secondary | ICD-10-CM | POA: Insufficient documentation

## 2018-08-11 ENCOUNTER — Ambulatory Visit (INDEPENDENT_AMBULATORY_CARE_PROVIDER_SITE_OTHER): Payer: Medicare Other | Admitting: Pulmonary Disease

## 2018-08-11 ENCOUNTER — Encounter: Payer: Self-pay | Admitting: Pulmonary Disease

## 2018-08-11 VITALS — BP 136/80 | HR 66 | Ht 59.0 in | Wt 114.0 lb

## 2018-08-11 DIAGNOSIS — J449 Chronic obstructive pulmonary disease, unspecified: Secondary | ICD-10-CM | POA: Diagnosis not present

## 2018-08-11 DIAGNOSIS — Z23 Encounter for immunization: Secondary | ICD-10-CM | POA: Diagnosis not present

## 2018-08-11 DIAGNOSIS — R0609 Other forms of dyspnea: Secondary | ICD-10-CM

## 2018-08-11 DIAGNOSIS — J4489 Other specified chronic obstructive pulmonary disease: Secondary | ICD-10-CM

## 2018-08-11 NOTE — Patient Instructions (Signed)
Continue Symbicort Continue albuterol inhaler as needed and prior to exercise Flu vaccination administered today Follow-up in 6 months.  Call sooner if needed

## 2018-08-11 NOTE — Progress Notes (Signed)
PULMONARY OFFICE FOLLOW UP NOTE   PT ID: 72 y.o. F never smoker previously followed by Dr Stevenson Clinch with diagnosis of mild COPD  PROBLEMS:  Mild COPD  Exertional dyspnea out of proportion to Spirometry findings Single vessel CAD - medical Rx  DATA: PFTs 11/20/14: mild obstruction, FEV1 1.47 liters, 88% predicted Spirometry 12/03/16: Mild obstruction, FEV1 1.57 liters, 88% predicted PFTs 06/08/17: mild obstruction. FEV1 1.69 L (87%), FEV1/FVC 63%. Lung volumes nad DLCO normal 08/04/18 PFTs: FVC: 2.57 L (109 %pred), FEV1: 1.56 L (82 %pred), FEV1/FVC: 61%, TLC: 4.89 L (129 %pred), DLCO 94 %pred, RV 2.33 (150% predicted).  There was no significant improvement with bronchodilator therapy.  Flow volume curve suggests small airways disease.    INTERVAL HISTORY: Last seen 06/21/17. No major events  SUBJ: This is a scheduled ROV.  She has no new complaints.  She continues to have mild to moderate exertional dyspnea.  Notably, she remains very active, exercising regularly.  She states that she can walk 2 flights of stairs rather briskly before she is out of breath.  She remains on Symbicort inhaler.  She only uses albuterol prior to exercise.  She never uses her albuterol as a "rescue inhaler".  She denies CP, fever, purulent sputum, hemoptysis, LE edema and calf tenderness.  OBJ: Vitals:   08/11/18 1055 08/11/18 1100  BP:  136/80  Pulse:  66  SpO2:  98%  Weight: 114 lb (51.7 kg)   Height: 4\' 11"  (1.499 m)   RA  Gen: NAD HEENT: NCAT, sclera white Neck: No JVD Lungs: breath sounds mildly diminished without wheezes or other adventitious sounds Cardiovascular: RRR, no murmurs Abdomen: Soft, nontender, normal BS Ext: without clubbing, cyanosis, edema Neuro: grossly intact Skin: Limited exam, no lesions noted   DATA: BMP Latest Ref Rng & Units 10/20/2016 03/10/2016 10/01/2014  Glucose 65 - 99 mg/dL 100(H) 92 96  BUN 7 - 25 mg/dL 10 13 11   Creatinine 0.60 - 0.93 mg/dL 0.60 0.67 0.6   Sodium 135 - 146 mmol/L 140 141 142  Potassium 3.5 - 5.3 mmol/L 5.2 4.6 4.6  Chloride 98 - 110 mmol/L 103 106 104  CO2 20 - 31 mmol/L 26 29 24   Calcium 8.6 - 10.4 mg/dL 9.6 9.2 9.4    CBC Latest Ref Rng & Units 10/20/2016 03/10/2016 10/01/2014  WBC 3.8 - 10.8 K/uL 8.9 6.9 9.5  Hemoglobin 11.7 - 15.5 g/dL 12.5 12.6 12.4  Hematocrit 35.0 - 45.0 % 37.9 37.7 37.7  Platelets 140 - 400 K/uL 483(H) 320.0 402.0(H)    CXR: no new film   IMPRESSION: 1) Mild COPD, likely due to chronic obstructive asthma - never smoker 2) Exertional dyspnea.  I think her complaints of dyspnea are in part due to very high expectations as she is extremely active for a woman of her age.  At this point, I do not believe that we need to investigate further for other potential contributors.  However, we did discuss the possibility of further evaluation if she is dissatisfied with her exercise tolerance and respiratory status in the future.  Further evaluation could include a cardiopulmonary stress test    PLAN: Continue Symbicort Continue albuterol inhaler as needed and prior to exercise Flu vaccination administered today Follow-up in 6 months.  Call sooner if neededvisit  Merton Border, MD PCCM service Mobile [phone] Pager [phone] 08/11/2018 12:42 PM

## 2018-08-12 ENCOUNTER — Encounter
Admission: RE | Disposition: A | Discharge status: Home / Self Care | Payer: Self-pay | Source: Ambulatory Visit | Attending: Unknown Physician Specialty

## 2018-08-12 ENCOUNTER — Ambulatory Visit: Payer: Medicare Other | Admitting: Anesthesiology

## 2018-08-12 ENCOUNTER — Encounter: Payer: Self-pay | Admitting: Anesthesiology

## 2018-08-12 ENCOUNTER — Ambulatory Visit
Admission: RE | Admit: 2018-08-12 | Discharge: 2018-08-12 | Disposition: A | Discharge status: Home / Self Care | Payer: Medicare Other | Source: Ambulatory Visit | Attending: Unknown Physician Specialty | Admitting: Unknown Physician Specialty

## 2018-08-12 DIAGNOSIS — Z881 Allergy status to other antibiotic agents status: Secondary | ICD-10-CM | POA: Insufficient documentation

## 2018-08-12 DIAGNOSIS — Z86010 Personal history of colonic polyps: Secondary | ICD-10-CM | POA: Insufficient documentation

## 2018-08-12 DIAGNOSIS — Z885 Allergy status to narcotic agent status: Secondary | ICD-10-CM | POA: Diagnosis not present

## 2018-08-12 DIAGNOSIS — R1310 Dysphagia, unspecified: Secondary | ICD-10-CM | POA: Diagnosis not present

## 2018-08-12 DIAGNOSIS — Z8711 Personal history of peptic ulcer disease: Secondary | ICD-10-CM | POA: Insufficient documentation

## 2018-08-12 DIAGNOSIS — Z8249 Family history of ischemic heart disease and other diseases of the circulatory system: Secondary | ICD-10-CM | POA: Diagnosis not present

## 2018-08-12 DIAGNOSIS — K222 Esophageal obstruction: Secondary | ICD-10-CM | POA: Insufficient documentation

## 2018-08-12 DIAGNOSIS — J301 Allergic rhinitis due to pollen: Secondary | ICD-10-CM | POA: Diagnosis not present

## 2018-08-12 DIAGNOSIS — K219 Gastro-esophageal reflux disease without esophagitis: Secondary | ICD-10-CM | POA: Insufficient documentation

## 2018-08-12 DIAGNOSIS — J449 Chronic obstructive pulmonary disease, unspecified: Secondary | ICD-10-CM | POA: Insufficient documentation

## 2018-08-12 DIAGNOSIS — I2510 Atherosclerotic heart disease of native coronary artery without angina pectoris: Secondary | ICD-10-CM | POA: Insufficient documentation

## 2018-08-12 DIAGNOSIS — Z886 Allergy status to analgesic agent status: Secondary | ICD-10-CM | POA: Insufficient documentation

## 2018-08-12 DIAGNOSIS — Z7982 Long term (current) use of aspirin: Secondary | ICD-10-CM | POA: Insufficient documentation

## 2018-08-12 DIAGNOSIS — Z79899 Other long term (current) drug therapy: Secondary | ICD-10-CM | POA: Insufficient documentation

## 2018-08-12 DIAGNOSIS — K2970 Gastritis, unspecified, without bleeding: Secondary | ICD-10-CM | POA: Diagnosis not present

## 2018-08-12 DIAGNOSIS — K3189 Other diseases of stomach and duodenum: Secondary | ICD-10-CM | POA: Insufficient documentation

## 2018-08-12 DIAGNOSIS — R12 Heartburn: Secondary | ICD-10-CM | POA: Insufficient documentation

## 2018-08-12 DIAGNOSIS — Z880 Allergy status to penicillin: Secondary | ICD-10-CM | POA: Insufficient documentation

## 2018-08-12 DIAGNOSIS — Z888 Allergy status to other drugs, medicaments and biological substances status: Secondary | ICD-10-CM | POA: Diagnosis not present

## 2018-08-12 DIAGNOSIS — I10 Essential (primary) hypertension: Secondary | ICD-10-CM | POA: Diagnosis not present

## 2018-08-12 HISTORY — PX: ESOPHAGOGASTRODUODENOSCOPY (EGD) WITH PROPOFOL: SHX5813

## 2018-08-12 HISTORY — DX: Atherosclerotic heart disease of native coronary artery without angina pectoris: I25.10

## 2018-08-12 HISTORY — DX: Essential (primary) hypertension: I10

## 2018-08-12 SURGERY — ESOPHAGOGASTRODUODENOSCOPY (EGD) WITH PROPOFOL
Anesthesia: General

## 2018-08-12 MED ORDER — PROPOFOL 500 MG/50ML IV EMUL
INTRAVENOUS | Status: DC | PRN
  Administered 2018-08-12: 140 ug/kg/min via INTRAVENOUS

## 2018-08-12 MED ORDER — LIDOCAINE HCL (CARDIAC) PF 100 MG/5ML IV SOSY
PREFILLED_SYRINGE | INTRAVENOUS | Status: DC | PRN
  Administered 2018-08-12: 50 mg via INTRAVENOUS

## 2018-08-12 MED ORDER — PROPOFOL 10 MG/ML IV BOLUS
INTRAVENOUS | Status: DC | PRN
  Administered 2018-08-12: 90 mg via INTRAVENOUS

## 2018-08-12 MED ORDER — PROPOFOL 10 MG/ML IV BOLUS
INTRAVENOUS | Status: AC
  Filled 2018-08-12: qty 20

## 2018-08-12 MED ORDER — SODIUM CHLORIDE 0.9 % IV SOLN
INTRAVENOUS | Status: DC
  Administered 2018-08-12: 1000 mL via INTRAVENOUS

## 2018-08-12 MED ORDER — SODIUM CHLORIDE 0.9 % IV SOLN: INTRAVENOUS | Status: DC

## 2018-08-12 MED ORDER — LIDOCAINE HCL (PF) 2 % IJ SOLN
INTRAMUSCULAR | Status: AC
  Filled 2018-08-12: qty 10

## 2018-08-12 NOTE — Anesthesia Postprocedure Evaluation (Signed)
Anesthesia Post Note  Patient: Natalie [name]  Procedure(s) Performed: ESOPHAGOGASTRODUODENOSCOPY (EGD) WITH PROPOFOL (N/A )  Patient location during evaluation: Endoscopy Anesthesia Type: General Level of consciousness: awake and alert Pain management: pain level controlled Vital Signs Assessment: post-procedure vital signs reviewed and stable Respiratory status: spontaneous breathing, nonlabored ventilation, respiratory function stable and patient connected to nasal cannula oxygen Cardiovascular status: blood pressure returned to baseline and stable Postop Assessment: no apparent nausea or vomiting Anesthetic complications: no     Last Vitals:  Vitals:   08/12/18 1300 08/12/18 1414  BP: 121/72   Pulse: 68   Resp: 16   Temp: (!) 36.3 C 36.6 C  SpO2: 100%     Last Pain:  Vitals:   08/12/18 1434  TempSrc:   PainSc: 0-No pain                 Jayen Bromwell S

## 2018-08-12 NOTE — H&P (Signed)
Primary Care Physician:  Glendon Axe, MD Primary Gastroenterologist:  Dr. Vira Agar  Pre-Procedure History & Physical: HPI:  Natalie [name] is a 72 y.o. female is here for an EGD for dysphagia.    Past Medical History:  Diagnosis Date  . Allergy   . Chest pain   . Colon polyps   . Coronary artery disease   . Gastric ulcer   . GERD (gastroesophageal reflux disease)   . Hypertension   . Nasal polyps    followed by Dr Tami Ribas    Past Surgical History:  Procedure Laterality Date  . ABDOMINAL HYSTERECTOMY  1984   due to fibroids; have ovaries.   . APPENDECTOMY  1952  . CARDIAC CATHETERIZATION N/A 10/22/2016   Procedure: Right/Left Heart Cath and Coronary Angiography;  Surgeon: Troy Sine, MD;  Location: [place] [facility];  Service: Cardiovascular;  Laterality: N/A;  . COLONOSCOPY WITH PROPOFOL N/A 03/03/2017   Procedure: COLONOSCOPY WITH PROPOFOL;  Surgeon: Manya Silvas, MD;  Location[facility] ENDOSCOPY;  Service: Endoscopy;  Laterality: N/A;  . HEMORROIDECTOMY  1995  . NASAL SINUS SURGERY  1988, 2008  . TONSILLECTOMY  1950  . VAGINAL DELIVERY     x 3    Prior to Admission medications   Medication Sig Start Date End Date Taking? Authorizing Provider  amLODipine (NORVASC) 2.5 MG tablet TAKE 1 TABLET(2.5 MG) BY MOUTH DAILY 02/08/18  Yes Croitoru, Mihai, MD  aspirin EC 81 MG tablet Take 81 mg by mouth at bedtime.    Yes [provider]  baclofen (LIORESAL) 10 MG tablet Take 10 mg by mouth at bedtime.    Yes [provider]  budesonide-formoterol (SYMBICORT) 80-4.5 MCG/ACT inhaler Inhale 2 puffs into the lungs 2 (two) times daily.   Yes [provider]  estradiol (ESTRACE) 0.5 MG tablet TAKE 1 TABLET(0.5 MG) BY MOUTH DAILY 05/23/18  Yes Arnett, Yvetta Coder, FNP  fexofenadine (ALLEGRA) 180 MG tablet Take 180 mg by mouth daily.   Yes [provider]  ipratropium (ATROVENT) 0.06 % nasal spray Place 2 sprays into both nostrils 2 (two)  times daily.   Yes [provider]  metoprolol tartrate (LOPRESSOR) 25 MG tablet Take 0.5 tablets (12.5 mg total) by mouth 2 (two) times daily. 11/16/17  Yes Barrett, Evelene Croon, PA-C  rosuvastatin (CRESTOR) 10 MG tablet Take 1 tablet (10 mg total) by mouth daily. 06/15/18 09/13/18 Yes Croitoru, Mihai, MD  SYMBICORT 160-4.5 MCG/ACT inhaler INHALE 2 PUFFS INTO THE LUNGS DAILY 03/25/18  Yes [name], MD  EPIPEN 2-PAK 0.3 MG/0.3ML SOAJ injection Inject 0.3 mLs into the skin as needed (allergic reactions).  09/05/14   [provider]  PROAIR HFA 108 [phone] Base) MCG/ACT inhaler USE AS DIRECTED PRIOR TO [place] 09/09/17   [name], MD  Probiotic Product (DIGESTIVE ADVANTAGE PO) Take 50,000 Units by mouth 2 (two) times daily at 8 am and 10 pm.     [provider]    Allergies as of 08/11/2018 - Review Complete 08/11/2018  Allergen Reaction Noted  . Aspirin Shortness Of Breath 02/26/2012  . Amoxicillin-pot clavulanate Nausea And Vomiting, Rash, Diarrhea, and Nausea Only 04/14/2013  . Atorvastatin Other (See Comments) 06/15/2018  . Clarithromycin Nausea And Vomiting 02/22/2012  . Clindamycin/lincomycin Other (See Comments) 08/11/2018  . Codeine Nausea And Vomiting 02/22/2012  . Soma [carisoprodol] Nausea And Vomiting 02/22/2012  . Tramadol Nausea And Vomiting 02/22/2012    Family History  Problem Relation Age of Onset  .  Heart disease Mother 64  . COPD Father 1  . Heart disease Brother 64       CABG at 14, Still living  . Cancer Daughter 109       Ovary  . Heart disease Maternal Grandmother   . Heart disease Maternal Grandfather   . Cancer Paternal Grandmother        Ovary - 59's and again in 98's  . COPD Paternal Grandfather   . Pneumonia Sister 50  . COPD Sister   . Breast cancer Neg Hx     Social History   Socioeconomic History  . Marital status: Married    Spouse name: Not on file  . Number of children: 3  . Years of education: Not on file  .  Highest education level: Not on file  Occupational History  . Occupation: Retired  Scientific laboratory technician  . Financial resource strain: Not on file  . Food insecurity:    Worry: Not on file    Inability: Not on file  . Transportation needs:    Medical: Not on file    Non-medical: Not on file  Tobacco Use  . Smoking status: Never Smoker  . Smokeless tobacco: Never Used  Substance and Sexual Activity  . Alcohol use: No  . Drug use: No  . Sexual activity: Not Currently  Lifestyle  . Physical activity:    Days per week: Not on file    Minutes per session: Not on file  . Stress: Not on file  Relationships  . Social connections:    Talks on phone: Not on file    Gets together: Not on file    Attends religious service: Not on file    Active member of club or organization: Not on file    Attends meetings of clubs or organizations: Not on file    Relationship status: Not on file  . Intimate partner violence:    Fear of current or ex partner: Not on file    Emotionally abused: Not on file    Physically abused: Not on file    Forced sexual activity: Not on file  Other Topics Concern  . Not on file  Social History Narrative   Pt lives with husband.    Review of Systems: See HPI, otherwise negative ROS  Physical Exam: BP 121/72   Pulse 68   Temp (!) 97.4 F (36.3 C) (Tympanic)   Resp 16   Ht 4\' 11"  (1.499 m)   Wt 50.8 kg   SpO2 100%   BMI 22.62 kg/m  General:   Alert,  pleasant and cooperative in NAD Head:  Normocephalic and atraumatic. Neck:  Supple; no masses or thyromegaly. Lungs:  Clear throughout to auscultation.    Heart:  Regular rate and rhythm. Abdomen:  Soft, nontender and nondistended. Normal bowel sounds, without guarding, and without rebound.   Neurologic:  Alert and  oriented x4;  grossly normal neurologically.  Impression/Plan: Natalie [name] is here for an endoscopy to be performed for dysphagia.  Risks, benefits, limitations, and alternatives  regarding  endoscopy have been reviewed with the patient.  Questions have been answered.  All parties agreeable.   Gaylyn Cheers, MD  08/12/2018, 1:53 PM

## 2018-08-12 NOTE — Transfer of Care (Signed)
Immediate Anesthesia Transfer of Care Note  Patient: Natalie [name]  Procedure(s) Performed: ESOPHAGOGASTRODUODENOSCOPY (EGD) WITH PROPOFOL (N/A )  Patient Location: PACU and Endoscopy Unit  Anesthesia Type:General  Level of Consciousness: drowsy  Airway & Oxygen Therapy: Patient Spontanous Breathing  Post-op Assessment: Report given to RN and Post -op Vital signs reviewed and stable  Post vital signs: Reviewed and stable  Last Vitals:  Vitals Value Taken Time  BP    Temp    Pulse    Resp    SpO2      Last Pain:  Vitals:   08/12/18 1300  TempSrc: Tympanic         Complications: No apparent anesthesia complications

## 2018-08-12 NOTE — Op Note (Signed)
[facility] Gastroenterology Patient Name: Natalie [name] Procedure Date: 08/12/2018 1:44 PM MRN: [id number] Account #: [id number] Date of Birth: [birth year] Admit Type: Outpatient Age: 72 Room: [facility] ENDO ROOM 3 Gender: Female Note Status: Finalized Procedure:            Upper GI endoscopy Indications:          Dysphagia, Heartburn, Suspected gastro-esophageal                        reflux disease Providers:            Manya Silvas, MD Referring MD:         Glendon Axe (Referring MD) Medicines:            Propofol per Anesthesia Complications:        No immediate complications. Procedure:            Pre-Anesthesia Assessment:                       - After reviewing the risks and benefits, the patient                        was deemed in satisfactory condition to undergo the                        procedure.                       After obtaining informed consent, the endoscope was                        passed under direct vision. Throughout the procedure,                        the patient's blood pressure, pulse, and oxygen                        saturations were monitored continuously. The Endoscope                        was introduced through the mouth, and advanced to the                        second part of duodenum. The upper GI endoscopy was                        accomplished without difficulty. The patient tolerated                        the procedure well. Findings:      A mild Schatzki ring was found at the gastroesophageal junction. After       total exam of the esophagus and stomach and duodenum a guide wire was       passed. A guidewire was placed and the scope was withdrawn. Dilation was       performed with a Savary dilator with mild resistance at 16 mm.      Diffuse mild inflammation characterized by erythema and granularity was       found in the gastric body and in the gastric antrum. Biopsies were taken       with a cold forceps for  histology. Biopsies were taken with a cold       forceps for Helicobacter pylori testing.      The pylorus was a little snug and the scope stretched it out a little.      some areas in the esophagus sugested local esophagitis.      Biopsies done at 30cm to check for this. Impression:           - Mild Schatzki ring. Dilated.                       - Gastritis. Biopsied. Recommendation:       - The findings and recommendations were discussed with                        the patient's family. Manya Silvas, MD 08/12/2018 2:20:53 PM This report has been signed electronically. Number of Addenda: 0 Note Initiated On: 08/12/2018 1:44 PM      [facility]

## 2018-08-12 NOTE — Anesthesia Preprocedure Evaluation (Addendum)
Anesthesia Evaluation  Patient identified by MRN, date of birth, ID band Patient awake    Reviewed: Allergy & Precautions, NPO status , Patient's Chart, lab work & pertinent test results, reviewed documented beta blocker date and time   Airway Mallampati: II  TM Distance: >3 FB     Dental  (+) Chipped   Pulmonary shortness of breath, COPD,           Cardiovascular hypertension, Pt. on home beta blockers and Pt. on medications + CAD       Neuro/Psych    GI/Hepatic PUD, GERD  ,  Endo/Other    Renal/GU      Musculoskeletal   Abdominal   Peds  Hematology   Anesthesia Other Findings Does not carry epi-pen. No stent in heart.`  Reproductive/Obstetrics                            Anesthesia Physical Anesthesia Plan  ASA: III  Anesthesia Plan: General   Post-op Pain Management:    Induction: Intravenous  PONV Risk Score and Plan:   Airway Management Planned:   Additional Equipment:   Intra-op Plan:   Post-operative Plan:   Informed Consent: I have reviewed the patients History and Physical, chart, labs and discussed the procedure including the risks, benefits and alternatives for the proposed anesthesia with the patient or authorized representative who has indicated his/her understanding and acceptance.     Plan Discussed with: CRNA  Anesthesia Plan Comments:         Anesthesia Quick Evaluation

## 2018-08-12 NOTE — Anesthesia Post-op Follow-up Note (Signed)
Anesthesia QCDR form completed.        

## 2018-08-16 ENCOUNTER — Encounter: Payer: Self-pay | Admitting: Unknown Physician Specialty

## 2018-08-17 LAB — SURGICAL PATHOLOGY

## 2018-08-22 DIAGNOSIS — R1310 Dysphagia, unspecified: Secondary | ICD-10-CM | POA: Diagnosis not present

## 2018-08-24 DIAGNOSIS — J301 Allergic rhinitis due to pollen: Secondary | ICD-10-CM | POA: Diagnosis not present

## 2018-09-06 DIAGNOSIS — R1310 Dysphagia, unspecified: Secondary | ICD-10-CM | POA: Diagnosis not present

## 2018-09-06 DIAGNOSIS — Z6821 Body mass index (BMI) 21.0-21.9, adult: Secondary | ICD-10-CM | POA: Diagnosis not present

## 2018-09-06 DIAGNOSIS — K224 Dyskinesia of esophagus: Secondary | ICD-10-CM | POA: Diagnosis not present

## 2018-09-07 DIAGNOSIS — K224 Dyskinesia of esophagus: Secondary | ICD-10-CM | POA: Diagnosis not present

## 2018-09-07 DIAGNOSIS — K219 Gastro-esophageal reflux disease without esophagitis: Secondary | ICD-10-CM | POA: Diagnosis not present

## 2018-09-15 DIAGNOSIS — J301 Allergic rhinitis due to pollen: Secondary | ICD-10-CM | POA: Diagnosis not present

## 2018-09-29 DIAGNOSIS — J301 Allergic rhinitis due to pollen: Secondary | ICD-10-CM | POA: Diagnosis not present

## 2018-10-05 DIAGNOSIS — J301 Allergic rhinitis due to pollen: Secondary | ICD-10-CM | POA: Diagnosis not present

## 2018-10-06 ENCOUNTER — Encounter: Payer: Self-pay | Admitting: Cardiovascular Disease

## 2018-10-06 DIAGNOSIS — R748 Abnormal levels of other serum enzymes: Secondary | ICD-10-CM | POA: Diagnosis not present

## 2018-10-06 DIAGNOSIS — E785 Hyperlipidemia, unspecified: Secondary | ICD-10-CM | POA: Diagnosis not present

## 2018-10-06 DIAGNOSIS — I25119 Atherosclerotic heart disease of native coronary artery with unspecified angina pectoris: Secondary | ICD-10-CM | POA: Diagnosis not present

## 2018-10-06 DIAGNOSIS — I10 Essential (primary) hypertension: Secondary | ICD-10-CM | POA: Diagnosis not present

## 2018-10-10 ENCOUNTER — Other Ambulatory Visit: Payer: Self-pay | Admitting: Family

## 2018-10-10 DIAGNOSIS — N951 Menopausal and female climacteric states: Secondary | ICD-10-CM

## 2018-10-11 NOTE — Telephone Encounter (Signed)
Last filled 08/06/18 No office visit scheduled  Last office visit scheduled  Left message for patient to call needs to schedule follow up appointment

## 2018-10-12 DIAGNOSIS — R1310 Dysphagia, unspecified: Secondary | ICD-10-CM | POA: Diagnosis not present

## 2018-10-12 DIAGNOSIS — Z6821 Body mass index (BMI) 21.0-21.9, adult: Secondary | ICD-10-CM | POA: Diagnosis not present

## 2018-10-12 DIAGNOSIS — R079 Chest pain, unspecified: Secondary | ICD-10-CM | POA: Diagnosis not present

## 2018-10-12 NOTE — Telephone Encounter (Signed)
Call pt for appt  Advise I will give 3 months supply estrace   No further refills until she has a f/u  Patients need to be seen at least annually for medications

## 2018-10-13 NOTE — Telephone Encounter (Signed)
Letter mailed to patient , also left message to call and schedule

## 2018-10-25 DIAGNOSIS — R1310 Dysphagia, unspecified: Secondary | ICD-10-CM | POA: Diagnosis not present

## 2018-10-25 DIAGNOSIS — Z6820 Body mass index (BMI) 20.0-20.9, adult: Secondary | ICD-10-CM | POA: Diagnosis not present

## 2018-10-27 ENCOUNTER — Other Ambulatory Visit: Payer: Self-pay

## 2018-10-27 MED ORDER — BUDESONIDE-FORMOTEROL FUMARATE 160-4.5 MCG/ACT IN AERO: INHALATION_SPRAY | RESPIRATORY_TRACT | 3 refills | Status: DC

## 2018-11-01 DIAGNOSIS — K2930 Chronic superficial gastritis without bleeding: Secondary | ICD-10-CM | POA: Diagnosis not present

## 2018-11-01 DIAGNOSIS — K222 Esophageal obstruction: Secondary | ICD-10-CM | POA: Diagnosis not present

## 2018-11-01 DIAGNOSIS — J301 Allergic rhinitis due to pollen: Secondary | ICD-10-CM | POA: Diagnosis not present

## 2018-11-01 DIAGNOSIS — K2970 Gastritis, unspecified, without bleeding: Secondary | ICD-10-CM | POA: Diagnosis not present

## 2018-11-01 DIAGNOSIS — Z79899 Other long term (current) drug therapy: Secondary | ICD-10-CM | POA: Diagnosis not present

## 2018-11-01 DIAGNOSIS — R1310 Dysphagia, unspecified: Secondary | ICD-10-CM | POA: Diagnosis not present

## 2018-11-01 DIAGNOSIS — J45909 Unspecified asthma, uncomplicated: Secondary | ICD-10-CM | POA: Diagnosis not present

## 2018-11-01 DIAGNOSIS — I2510 Atherosclerotic heart disease of native coronary artery without angina pectoris: Secondary | ICD-10-CM | POA: Diagnosis not present

## 2018-11-01 DIAGNOSIS — K449 Diaphragmatic hernia without obstruction or gangrene: Secondary | ICD-10-CM | POA: Diagnosis not present

## 2018-11-01 DIAGNOSIS — Z7982 Long term (current) use of aspirin: Secondary | ICD-10-CM | POA: Diagnosis not present

## 2018-11-09 DIAGNOSIS — J301 Allergic rhinitis due to pollen: Secondary | ICD-10-CM | POA: Diagnosis not present

## 2018-11-17 DIAGNOSIS — J301 Allergic rhinitis due to pollen: Secondary | ICD-10-CM | POA: Diagnosis not present

## 2018-11-28 DIAGNOSIS — B9562 Methicillin resistant Staphylococcus aureus infection as the cause of diseases classified elsewhere: Secondary | ICD-10-CM | POA: Diagnosis not present

## 2018-11-28 DIAGNOSIS — J340 Abscess, furuncle and carbuncle of nose: Secondary | ICD-10-CM | POA: Diagnosis not present

## 2018-11-28 DIAGNOSIS — J301 Allergic rhinitis due to pollen: Secondary | ICD-10-CM | POA: Diagnosis not present

## 2018-11-30 DIAGNOSIS — K200 Eosinophilic esophagitis: Secondary | ICD-10-CM | POA: Diagnosis not present

## 2018-11-30 DIAGNOSIS — Z6820 Body mass index (BMI) 20.0-20.9, adult: Secondary | ICD-10-CM | POA: Diagnosis not present

## 2018-11-30 DIAGNOSIS — R079 Chest pain, unspecified: Secondary | ICD-10-CM | POA: Diagnosis not present

## 2018-11-30 DIAGNOSIS — R1310 Dysphagia, unspecified: Secondary | ICD-10-CM | POA: Diagnosis not present

## 2018-12-10 ENCOUNTER — Other Ambulatory Visit: Payer: Self-pay | Admitting: Family

## 2018-12-10 DIAGNOSIS — N951 Menopausal and female climacteric states: Secondary | ICD-10-CM

## 2018-12-12 DIAGNOSIS — J301 Allergic rhinitis due to pollen: Secondary | ICD-10-CM | POA: Diagnosis not present

## 2018-12-12 DIAGNOSIS — B9562 Methicillin resistant Staphylococcus aureus infection as the cause of diseases classified elsewhere: Secondary | ICD-10-CM | POA: Diagnosis not present

## 2018-12-12 DIAGNOSIS — J340 Abscess, furuncle and carbuncle of nose: Secondary | ICD-10-CM | POA: Diagnosis not present

## 2018-12-13 ENCOUNTER — Other Ambulatory Visit: Payer: Self-pay | Admitting: Family

## 2018-12-13 DIAGNOSIS — N951 Menopausal and female climacteric states: Secondary | ICD-10-CM

## 2018-12-22 ENCOUNTER — Encounter: Payer: Self-pay | Admitting: Cardiovascular Disease

## 2018-12-22 ENCOUNTER — Ambulatory Visit (INDEPENDENT_AMBULATORY_CARE_PROVIDER_SITE_OTHER): Payer: Medicare Other | Admitting: Cardiovascular Disease

## 2018-12-22 VITALS — BP 100/62 | HR 66 | Ht 59.0 in | Wt 101.2 lb

## 2018-12-22 DIAGNOSIS — E7800 Pure hypercholesterolemia, unspecified: Secondary | ICD-10-CM

## 2018-12-22 DIAGNOSIS — R072 Precordial pain: Secondary | ICD-10-CM | POA: Diagnosis not present

## 2018-12-22 NOTE — Patient Instructions (Addendum)
Medication Instructions:  Dr Sallyanne Kuster has recommended making the following medication changes: 1. WEAN OFF Metoprolol - take 1/4 tablet for 2 days then STOP 2. 1 WEEK LATER STOP Amlodipine  If you need a refill on your cardiac medications before your next appointment, please call your pharmacy.   Follow-Up: At [facility], you and your health needs are our priority.  As part of our continuing mission to provide you with exceptional heart care, we have created designated Provider Care Teams.  These Care Teams include your primary Cardiologist (physician) and Advanced Practice Providers (APPs -  Physician Assistants and Nurse Practitioners) who all work together to provide you with the care you need, when you need it. You will need a follow up appointment in 12 months. You may see Sanda Klein, MD or one of the following Advanced Practice Providers on your designated Care Team: [place], [place] . Fabian Sharp, PA-C . You will receive a reminder letter in the mail two months in advance. If you don't receive a letter, please call our office to schedule the follow-up appointment.

## 2018-12-22 NOTE — Progress Notes (Signed)
Cardiology Office Note:    Date:  12/22/2018   ID:  Natalie [name], DOB Apr 23, 1946, MRN [id number]  PCP:  Glendon Axe, MD  Cardiologist:  Sanda Klein, MD  Electrophysiologist:  None   Referring MD: Glendon Axe, MD   Chief Complaint  Patient presents with  . Chest Pain    History of Present Illness:    Natalie [name] is a 73 y.o. female with a family history of coronary artery disease presented several years ago with complaints of chest pain and dyspnea improved after diagnosis of eosinophilic esophagitis with spasm and appropriate therapy.  Coronary angiography in 2017 did not show meaningful coronary artery disease and she has normal left ventricular systolic function and normal heart filling pressures.  For most of 2019 she had a lot of difficulty eating with frequent vomiting.  She has lost substantial weight.  Her body mass index now is 20 and on her home scale she weighs less than 100 pounds.  She has been feeling better for the last couple of months.  She sleeps on an incline.  She is taking Symbicort for reactive airway disease, her pulmonologist is Dr. Alva Garnet.  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  Some lipid parameters noted on labs performed for 2019: Glucose 79, potassium 4.0, creatinine 0.7, normal liver function tests, albumin 4.1, total cholesterol 114, triglycerides 58, 48, HDL 54.  Past Medical History:  Diagnosis Date  . Allergy   . Chest pain   . Colon polyps   . Coronary artery disease   . Gastric ulcer   . GERD (gastroesophageal reflux disease)   . Hypertension   . Nasal polyps    followed by Dr Tami Ribas    Past Surgical History:  Procedure Laterality Date  . ABDOMINAL HYSTERECTOMY  1984   due to fibroids; have ovaries.   . APPENDECTOMY  1952  . CARDIAC CATHETERIZATION N/A 10/22/2016   Procedure: Right/Left Heart Cath and Coronary  Angiography;  Surgeon: Troy Sine, MD;  Location: [place] [facility];  Service: Cardiovascular;  Laterality: N/A;  . COLONOSCOPY WITH PROPOFOL N/A 03/03/2017   Procedure: COLONOSCOPY WITH PROPOFOL;  Surgeon: Manya Silvas, MD;  Location: [facility] ENDOSCOPY;  Service: Endoscopy;  Laterality: N/A;  . ESOPHAGOGASTRODUODENOSCOPY (EGD) WITH PROPOFOL N/A 08/12/2018   Procedure: ESOPHAGOGASTRODUODENOSCOPY (EGD) WITH PROPOFOL;  Surgeon: Manya Silvas, MD;  Location: [facility] ENDOSCOPY;  Service: Endoscopy;  Laterality: N/A;  . HEMORROIDECTOMY  1995  . NASAL SINUS SURGERY  1988, 2008  . TONSILLECTOMY  1950  . VAGINAL DELIVERY     x 3    Current Medications: Current Meds  Medication Sig  . aspirin EC 81 MG tablet Take 81 mg by mouth at bedtime.   . baclofen (LIORESAL) 10 MG tablet Take 10 mg by mouth at bedtime.   . budesonide-formoterol (SYMBICORT) 160-4.5 MCG/ACT inhaler INHALE 2 PUFFS INTO THE LUNGS DAILY  . budesonide-formoterol (SYMBICORT) 80-4.5 MCG/ACT inhaler Inhale 2 puffs into the lungs 2 (two) times daily.  Marland Kitchen EPIPEN 2-PAK 0.3 MG/0.3ML SOAJ injection Inject 0.3 mLs into the skin as needed (allergic reactions).   Marland Kitchen estradiol (ESTRACE) 0.5 MG tablet TAKE 1 TABLET(0.5 MG) BY MOUTH DAILY  . fexofenadine (ALLEGRA) 180 MG tablet Take 180 mg by mouth daily.  Marland Kitchen ipratropium (ATROVENT) 0.06 % nasal spray Place 2 sprays into both nostrils 2 (two) times daily.  Marland Kitchen PROAIR HFA 108 (90 Base) MCG/ACT inhaler  USE AS DIRECTED PRIOR TO EXHERTION  . Probiotic Product (DIGESTIVE ADVANTAGE PO) Take 50,000 Units by mouth 2 (two) times daily at 8 am and 10 pm.   . [DISCONTINUED] amLODipine (NORVASC) 2.5 MG tablet TAKE 1 TABLET(2.5 MG) BY MOUTH DAILY  . [DISCONTINUED] metoprolol tartrate (LOPRESSOR) 25 MG tablet Take 0.5 tablets (12.5 mg total) by mouth 2 (two) times daily.     Allergies:   Aspirin; Amoxicillin-pot clavulanate; Atorvastatin; Clarithromycin; Clindamycin/lincomycin; Codeine; Soma [carisoprodol];  and Tramadol   Social History   Socioeconomic History  . Marital status: Married    Spouse name: Not on file  . Number of children: 3  . Years of education: Not on file  . Highest education level: Not on file  Occupational History  . Occupation: Retired  Scientific laboratory technician  . Financial resource strain: Not on file  . Food insecurity:    Worry: Not on file    Inability: Not on file  . Transportation needs:    Medical: Not on file    Non-medical: Not on file  Tobacco Use  . Smoking status: Never Smoker  . Smokeless tobacco: Never Used  Substance and Sexual Activity  . Alcohol use: No  . Drug use: No  . Sexual activity: Not Currently  Lifestyle  . Physical activity:    Days per week: Not on file    Minutes per session: Not on file  . Stress: Not on file  Relationships  . Social connections:    Talks on phone: Not on file    Gets together: Not on file    Attends religious service: Not on file    Active member of club or organization: Not on file    Attends meetings of clubs or organizations: Not on file    Relationship status: Not on file  Other Topics Concern  . Not on file  Social History Narrative   Pt lives with husband.     Family History: The patient's family history includes COPD in her paternal grandfather and sister; COPD (age of onset: 66) in her father; Cancer in her paternal grandmother; Cancer (age of onset: 36) in her daughter; Heart disease in her maternal grandfather and maternal grandmother; Heart disease (age of onset: 63) in her brother; Heart disease (age of onset: 72) in her mother; Pneumonia (age of onset: 21) in her sister. There is no history of Breast cancer.  ROS:   Please see the history of present illness.     All other systems reviewed and are negative.  EKGs/Labs/Other Studies Reviewed:    The following studies were reviewed today: Labs and notes from primary care provider.  EKG:  EKG is  ordered today.  The ekg ordered today demonstrates  sinus rhythm, normal tracing  Recent Labs: Glucose 79, potassium 4.0, creatinine 0.7, normal liver function tests, albumin 4.1,  Recent Lipid Panel    Component Value Date/Time   CHOL 214 (H) 03/10/2016 1019   TRIG 70.0 03/10/2016 1019   HDL 81.80 03/10/2016 1019   CHOLHDL 3 03/10/2016 1019   VLDL 14.0 03/10/2016 1019   LDLCALC 118 (H) 03/10/2016 1019   October 2019 total cholesterol 114, triglycerides 58, 48, HDL 54.  Physical Exam:    VS:  BP 100/62   Pulse 66   Ht 4\' 11"  (1.499 m)   Wt 101 lb 3.2 oz (45.9 kg)   BMI 20.44 kg/m     Wt Readings from Last 3 Encounters:  12/22/18 101 lb 3.2 oz (45.9 kg)  08/12/18 112 lb (50.8 kg)  08/11/18 114 lb (51.7 kg)     GEN: Extremely lean, looks very fit, younger than stated age, well nourished, well developed in no acute distress HEENT: Normal NECK: No JVD; No carotid bruits LYMPHATICS: No lymphadenopathy CARDIAC: RRR, no murmurs, rubs, gallops RESPIRATORY:  Clear to auscultation without rales, wheezing or rhonchi  ABDOMEN: Soft, non-tender, non-distended MUSCULOSKELETAL:  No edema; No deformity  SKIN: Warm and dry NEUROLOGIC:  Alert and oriented x 3 PSYCHIATRIC:  Normal affect   ASSESSMENT:    1. Precordial pain   2. Hypercholesterolemia    PLAN:    In order of problems listed above:  1. Chest pain: Not related to coronary disease.  Suspect esophageal spasm was the cause rather than coronary spasm.  Will gradually remove her antianginal medications.  Stop metoprolol first.  May actually improve her exercise tolerance.  If no adverse events stop amlodipine next.  Blood pressure is quite low. 2. HLP: All lipid parameters are excellent.  She follows a very healthy lifestyle. 3. She is borderline underweight, avoid additional weight loss.  Eat highly nutritious foods rich in protein and relatively high calorie/low bulk.   Medication Adjustments/Labs and Tests Ordered: Current medicines are reviewed at length with the  patient today.  Concerns regarding medicines are outlined above.  Orders Placed This Encounter  Procedures  . EKG 12-Lead   No orders of the defined types were placed in this encounter.   Patient Instructions  Medication Instructions:  Dr Sallyanne Kuster has recommended making the following medication changes: 1. WEAN OFF Metoprolol - take 1/4 tablet for 2 days then STOP 2. 1 WEEK LATER STOP Amlodipine  If you need a refill on your cardiac medications before your next appointment, please call your pharmacy.   Follow-Up: At [facility], you and your health needs are our priority.  As part of our continuing mission to provide you with exceptional heart care, we have created designated Provider Care Teams.  These Care Teams include your primary Cardiologist (physician) and Advanced Practice Providers (APPs -  Physician Assistants and Nurse Practitioners) who all work together to provide you with the care you need, when you need it. You will need a follow up appointment in 12 months. You may see Sanda Klein, MD or one of the following Advanced Practice Providers on your designated Care Team: [place], [place] . Fabian Sharp, PA-C . You will receive a reminder letter in the mail two months in advance. If you don't receive a letter, please call our office to schedule the follow-up appointment.    Signed, Sanda Klein, MD  12/22/2018 11:08 AM    [place]

## 2019-02-03 ENCOUNTER — Encounter: Payer: Self-pay | Admitting: Family Medicine

## 2019-02-09 ENCOUNTER — Ambulatory Visit (INDEPENDENT_AMBULATORY_CARE_PROVIDER_SITE_OTHER): Payer: Medicare Other | Admitting: Pulmonary Disease

## 2019-02-09 ENCOUNTER — Encounter: Payer: Self-pay | Admitting: Pulmonary Disease

## 2019-02-09 VITALS — BP 132/64 | HR 82 | Resp 16 | Ht 59.0 in | Wt 102.0 lb

## 2019-02-09 DIAGNOSIS — J449 Chronic obstructive pulmonary disease, unspecified: Secondary | ICD-10-CM | POA: Diagnosis not present

## 2019-02-09 MED ORDER — BUDESONIDE-FORMOTEROL FUMARATE 160-4.5 MCG/ACT IN AERO: INHALATION_SPRAY | RESPIRATORY_TRACT | 12 refills | Status: AC

## 2019-02-09 MED ORDER — ALBUTEROL SULFATE HFA 108 (90 BASE) MCG/ACT IN AERS: 1.0000 | INHALATION_SPRAY | Freq: Four times a day (QID) | RESPIRATORY_TRACT | 0 refills | Status: DC | PRN

## 2019-02-09 NOTE — Patient Instructions (Signed)
Continue Symbicort inhaler, 2 actuations twice a day. Continue albuterol inhaler as needed for increased shortness of breath, chest tightness, wheezing, shortness of breath Follow-up in this office as needed

## 2019-02-09 NOTE — Progress Notes (Signed)
PULMONARY OFFICE FOLLOW UP NOTE   PT ID: 73 y.o. F never smoker previously followed by Dr Stevenson Clinch with diagnosis of mild COPD  PROBLEMS:  Mild COPD  Exertional dyspnea out of proportion to Spirometry findings Single vessel CAD - medical Rx  DATA: PFTs 11/20/14: mild obstruction, FEV1 1.47 liters, 88% predicted Spirometry 12/03/16: Mild obstruction, FEV1 1.57 liters, 88% predicted PFTs 06/08/17: mild obstruction. FEV1 1.69 L (87%), FEV1/FVC 63%. Lung volumes nad DLCO normal 08/04/18 PFTs: FVC: 2.57 L (109 %pred), FEV1: 1.56 L (82 %pred), FEV1/FVC: 61%, TLC: 4.89 L (129 %pred), DLCO 94 %pred, RV 2.33 (150% predicted).  There was no significant improvement with bronchodilator therapy.  Flow volume curve suggests small airways disease.    INTERVAL HISTORY: Last seen 08/11/18. No major events  SUBJ: This is a scheduled ROV.  She has no new complaints.  She continues to have exertional dyspnea but remains very active and exercises regularly.  She has adapted to her exercise limitation.  She remains on Symbicort inhaler.  She uses albuterol prior to exercise which helps.  Otherwise, she never uses albuterol as a "rescue inhaler".  She denies CP, fever, purulent sputum, hemoptysis, LE edema and calf tenderness.  OBJ: Vitals:   02/09/19 1334 02/09/19 1338  BP:  132/64  Pulse:  82  Resp: 16   SpO2:  99%  Weight: 102 lb (46.3 kg)   Height: 4\' 11"  (1.499 m)   RA  Gen: NAD HEENT: NCAT, sclerae white Neck: No JVD Lungs: breath sounds full, no wheezes or other adventitious sounds Cardiovascular: RRR, no murmurs Abdomen: Soft, nontender, normal BS Ext: without clubbing, cyanosis, edema Neuro: grossly intact Skin: Limited exam, no lesions noted     DATA: BMP Latest Ref Rng & Units 10/20/2016 03/10/2016 10/01/2014  Glucose 65 - 99 mg/dL 100(H) 92 96  BUN 7 - 25 mg/dL 10 13 11   Creatinine 0.60 - 0.93 mg/dL 0.60 0.67 0.6  Sodium 135 - 146 mmol/L 140 141 142  Potassium 3.5 - 5.3 mmol/L  5.2 4.6 4.6  Chloride 98 - 110 mmol/L 103 106 104  CO2 20 - 31 mmol/L 26 29 24   Calcium 8.6 - 10.4 mg/dL 9.6 9.2 9.4    CBC Latest Ref Rng & Units 10/20/2016 03/10/2016 10/01/2014  WBC 3.8 - 10.8 K/uL 8.9 6.9 9.5  Hemoglobin 11.7 - 15.5 g/dL 12.5 12.6 12.4  Hematocrit 35.0 - 45.0 % 37.9 37.7 37.7  Platelets 140 - 400 K/uL 483(H) 320.0 402.0(H)    CXR: no new film   IMPRESSION:   ICD-10-CM   1. Chronic obstructive asthma ([facility]) J44.9   2. COPD, mild (HCC)Chronic J44.9     PLAN: Continue Symbicort inhaler, 2 actuations twice daily Continue albuterol inhaler as needed and prior to exercise Follow-up in this office as needed for any pulmonary or respiratory problems  Merton Border, MD PCCM service Mobile [phone] Pager [phone] 02/12/2019 4:15 PM

## 2019-05-19 ENCOUNTER — Other Ambulatory Visit: Payer: Self-pay | Admitting: Family

## 2019-05-19 DIAGNOSIS — N951 Menopausal and female climacteric states: Secondary | ICD-10-CM

## 2019-06-28 ENCOUNTER — Other Ambulatory Visit: Payer: Self-pay | Admitting: Cardiovascular Disease

## 2019-09-05 ENCOUNTER — Other Ambulatory Visit: Payer: Self-pay | Admitting: Pulmonary Disease

## 2019-12-13 ENCOUNTER — Other Ambulatory Visit: Payer: Self-pay | Admitting: Cardiovascular Disease

## 2019-12-13 NOTE — Telephone Encounter (Signed)
Rx(s) sent to pharmacy electronically.  

## 2020-01-02 ENCOUNTER — Other Ambulatory Visit (INDEPENDENT_AMBULATORY_CARE_PROVIDER_SITE_OTHER): Payer: Self-pay | Admitting: Nurse Practitioner

## 2020-01-02 DIAGNOSIS — I83819 Varicose veins of unspecified lower extremities with pain: Secondary | ICD-10-CM

## 2020-01-05 ENCOUNTER — Ambulatory Visit (INDEPENDENT_AMBULATORY_CARE_PROVIDER_SITE_OTHER): Payer: Medicare Other | Admitting: Nurse Practitioner

## 2020-01-05 ENCOUNTER — Encounter (INDEPENDENT_AMBULATORY_CARE_PROVIDER_SITE_OTHER): Payer: Self-pay | Admitting: Nurse Practitioner

## 2020-01-05 ENCOUNTER — Other Ambulatory Visit: Payer: Self-pay

## 2020-01-05 ENCOUNTER — Ambulatory Visit (INDEPENDENT_AMBULATORY_CARE_PROVIDER_SITE_OTHER): Payer: Medicare Other

## 2020-01-05 VITALS — BP 138/74 | HR 70 | Resp 18 | Ht 59.0 in | Wt 109.0 lb

## 2020-01-05 DIAGNOSIS — I83819 Varicose veins of unspecified lower extremities with pain: Secondary | ICD-10-CM | POA: Diagnosis not present

## 2020-01-05 DIAGNOSIS — I10 Essential (primary) hypertension: Secondary | ICD-10-CM

## 2020-01-10 ENCOUNTER — Encounter (INDEPENDENT_AMBULATORY_CARE_PROVIDER_SITE_OTHER): Payer: Self-pay | Admitting: Nurse Practitioner

## 2020-01-10 NOTE — Progress Notes (Signed)
SUBJECTIVE:  Patient ID: Natalie [name], female    DOB: 25-Oct-1946, 74 y.o.   MRN: [id number] Chief Complaint  Patient presents with  . New Patient (Initial Visit)    HPI  Natalie [name] is a 74 y.o. female The patient is seen for evaluation of symptomatic varicose veins. The patient relates burning and stinging which worsened steadily throughout the course of the day, particularly with standing.The right seems to be more affected. The patient also notes an aching and throbbing pain over the varicosities, particularly with prolonged dependent positions. The symptoms are significantly improved with elevation.  The patient also notes that during hot weather the symptoms are greatly intensified. The patient states the pain from the varicose veins interferes with work, daily exercise, shopping and household maintenance. At this point, the symptoms are persistent and severe enough that they're having a negative impact on lifestyle and are interfering with daily activities.  The patient has worn graduated compression in the past.  There is a previous history of sclerotherapy however the patient had a negative experience does not wish to undergo sclerotherapy again.  Today there is no evidence of DVT bilaterally.  There is no evidence of superficial venous thrombosis bilaterally.  The right lower extremity has a Baker's cyst measuring 4.3 cm x 0.92 cm x 1.58 cm.  The left lower extremity has reflux in the femoral vein.  There is no superficial chronic venous insufficiency bilaterally.    Past Medical History:  Diagnosis Date  . Allergy   . Chest pain   . Colon polyps   . Coronary artery disease   . Gastric ulcer   . GERD (gastroesophageal reflux disease)   . Hypertension   . Nasal polyps    followed by Dr Tami Ribas    Past Surgical History:  Procedure Laterality Date  . ABDOMINAL HYSTERECTOMY  1984   due to fibroids; have ovaries.   . APPENDECTOMY  1952  . CARDIAC  CATHETERIZATION N/A 10/22/2016   Procedure: Right/Left Heart Cath and Coronary Angiography;  Surgeon: Troy Sine, MD;  Location: [facility];  Service: Cardiovascular;  Laterality: N/A;  . COLONOSCOPY WITH PROPOFOL N/A 03/03/2017   Procedure: COLONOSCOPY WITH PROPOFOL;  Surgeon: Manya Silvas, MD;  Location: [facility] ENDOSCOPY;  Service: Endoscopy;  Laterality: N/A;  . ESOPHAGOGASTRODUODENOSCOPY (EGD) WITH PROPOFOL N/A 08/12/2018   Procedure: ESOPHAGOGASTRODUODENOSCOPY (EGD) WITH PROPOFOL;  Surgeon: Manya Silvas, MD;  Location: [facility] ENDOSCOPY;  Service: Endoscopy;  Laterality: N/A;  . HEMORROIDECTOMY  1995  . NASAL SINUS SURGERY  1988, 2008  . TONSILLECTOMY  1950  . VAGINAL DELIVERY     x 3    Social History   Socioeconomic History  . Marital status: Married    Spouse name: Not on file  . Number of children: 3  . Years of education: Not on file  . Highest education level: Not on file  Occupational History  . Occupation: Retired  Tobacco Use  . Smoking status: Never Smoker  . Smokeless tobacco: Never Used  Substance and Sexual Activity  . Alcohol use: No  . Drug use: No  . Sexual activity: Not Currently  Other Topics Concern  . Not on file  Social History Narrative   Pt lives with husband.   Social Determinants of Health   Financial Resource Strain:   . Difficulty of Paying Living Expenses: Not on file  Food Insecurity:   . Worried About Charity fundraiser in the Last Year: Not  on file  . Ran Out of Food in the Last Year: Not on file  Transportation Needs:   . Lack of Transportation (Medical): Not on file  . Lack of Transportation (Non-Medical): Not on file  Physical Activity:   . Days of Exercise per Week: Not on file  . Minutes of Exercise per Session: Not on file  Stress:   . Feeling of Stress : Not on file  Social Connections:   . Frequency of Communication with Friends and Family: Not on file  . Frequency of Social Gatherings with Friends and  Family: Not on file  . Attends Religious Services: Not on file  . Active Member of Clubs or Organizations: Not on file  . Attends Archivist Meetings: Not on file  . Marital Status: Not on file  Intimate Partner Violence:   . Fear of Current or Ex-Partner: Not on file  . Emotionally Abused: Not on file  . Physically Abused: Not on file  . Sexually Abused: Not on file    Family History  Problem Relation Age of Onset  . Heart disease Mother 24  . COPD Father 75  . Heart disease Brother 34       CABG at 43, Still living  . Cancer Daughter 52       Ovary  . Heart disease Maternal Grandmother   . Heart disease Maternal Grandfather   . Cancer Paternal Grandmother        Ovary - 79's and again in 27's  . COPD Paternal Grandfather   . Pneumonia Sister 70  . COPD Sister   . Breast cancer Neg Hx     Allergies  Allergen Reactions  . Aspirin Shortness Of Breath and Nausea And Vomiting    Can not take full strength aspirin b/c gets very congested in throat.Can take an enteric coated baby aspirin EC Maximum Strength EC Maximum Strength Can not take full strength aspirin b/c gets very congested in throat.Can take an enteric coated baby aspirin EC Maximum Strength Can not take full strength aspirin b/c gets very congested in throat. Can take an enteric coated baby aspirin EC Maximum Strength   . Amoxicillin-Pot Clavulanate Nausea And Vomiting, Rash, Diarrhea and Nausea Only    Other reaction(s): Nausea And Vomiting, Vomiting Has patient had a PCN reaction causing immediate rash, facial/tongue/throat swelling, SOB or lightheadedness with hypotension: Yes Has patient had a PCN reaction causing severe rash involving mucus membranes or skin necrosis: No Has patient had a PCN reaction that required hospitalization No Has patient had a PCN reaction occurring within the last 10 years: No If all of the above answers are "NO", then may proceed with Cephalosporin use. Has patient  had a PCN reaction causing immediate rash, facial/tongue/throat swelling, SOB or lightheadedness with hypotension: Yes Has patient had a PCN reaction causing severe rash involving mucus membranes or skin necrosis: No Has patient had a PCN reaction that required hospitalization No Has patient had a PCN reaction occurring within the last 10 years: No If all of the above answers are "NO", then may proceed with Cephalosporin use.  Has patient had a PCN reaction causing immediate rash, facial/tongue/throat swelling, SOB or lightheadedness with hypotension: Yes Has patient had a PCN reaction causing severe rash involving mucus membranes or skin necrosis: No Has patient had a PCN reaction that required hospitalization No Has patient had a PCN reaction occurring within the last 10 years: No If all of the above answers are "NO", then may proceed  with Cephalosporin use. Other reaction(s): Nausea And Vomiting, Vomiting Has patient had a PCN reaction causing immediate rash, facial/tongue/throat swelling, SOB or lightheadedness with hypotension: Yes Has patient had a PCN reaction causing severe rash involving mucus membranes or skin necrosis: No Has patient had a PCN reaction that required hospitalization No Has patient had a PCN reaction occurring within the last 10 years: No If all of the above answers are "NO", then may proceed with Cephalosporin use. Has patient had a PCN reaction causing immediate rash, facial/tongue/throat swelling, SOB or lightheadedness with hypotension: Yes Has patient had a PCN reaction causing severe rash involving mucus membranes or skin necrosis: No Has patient had a PCN reaction that required hospitalization No Has patient had a PCN reaction occurring within the last 10 years: No If all of the above answers are "NO", then may proceed with Cephalosporin use.  . Atorvastatin Other (See Comments)    myalgias Other reaction(s): Other (See Comments) myalgias  .  Clindamycin/Lincomycin Other (See Comments)  . Codeine Nausea And Vomiting  . Tramadol Nausea And Vomiting  . Carisoprodol Nausea And Vomiting  . Clarithromycin Nausea And Vomiting     Review of Systems   Review of Systems: Negative Unless Checked Constitutional: [] Weight loss  [] Fever  [] Chills Cardiac: [] Chest pain   []  Atrial Fibrillation  [] Palpitations   [] Shortness of breath when laying flat   [] Shortness of breath with exertion. [] Shortness of breath at rest Vascular:  [] Pain in legs with walking   [] Pain in legs with standing [] Pain in legs when laying flat   [] Claudication    [] Pain in feet when laying flat    [] History of DVT   [] Phlebitis   [] Swelling in legs   [x] Varicose veins   [] Non-healing ulcers Pulmonary:   [] Uses home oxygen   [] Productive cough   [] Hemoptysis   [] Wheeze  [x] COPD   [] Asthma Neurologic:  [] Dizziness   [] Seizures  [] Blackouts [] History of stroke   [] History of TIA  [] Aphasia   [] Temporary Blindness   [] Weakness or numbness in arm   [] Weakness or numbness in leg Musculoskeletal:   [] Joint swelling   [] Joint pain   [] Low back pain  []  History of Knee Replacement [] Arthritis [] back Surgeries  []  Spinal Stenosis    Hematologic:  [] Easy bruising  [] Easy bleeding   [] Hypercoagulable state   [] Anemic Gastrointestinal:  [] Diarrhea   [] Vomiting  [] Gastroesophageal reflux/heartburn   [] Difficulty swallowing. [] Abdominal pain Genitourinary:  [] Chronic kidney disease   [] Difficult urination  [] Anuric   [] Blood in urine [] Frequent urination  [] Burning with urination   [] Hematuria Skin:  [] Rashes   [] Ulcers [] Wounds Psychological:  [] History of anxiety   []  History of major depression  []  Memory Difficulties      OBJECTIVE:   Physical Exam  BP 138/74 (BP Location: Right Arm)   Pulse 70   Resp 18   Ht 4\' 11"  (1.499 m)   Wt 109 lb (49.4 kg)   BMI 22.02 kg/m   Gen: WD/WN, NAD Head: [place]/AT, No temporalis wasting.  Ear/Nose/Throat: Hearing grossly intact, nares w/o  erythema or drainage Eyes: PER, EOMI, sclera nonicteric.  Neck: Supple, no masses.  No JVD.  Pulmonary:  Good air movement, no use of accessory muscles.  Cardiac: RRR Vascular: scattered spider varicosities bilaterally  Vessel Right Left  Dorsalis Pedis Palpable Palpable   Gastrointestinal: soft, non-distended. No guarding/no peritoneal signs.  Musculoskeletal: M/S 5/5 throughout.  No deformity or atrophy.  Neurologic: Pain and light touch intact in extremities.  Symmetrical.  Speech is fluent. Motor exam as listed above. Psychiatric: Judgment intact, Mood & affect appropriate for pt's clinical situation. Dermatologic: No Venous rashes. No Ulcers Noted.  No changes consistent with cellulitis. Lymph : No Cervical lymphadenopathy, no lichenification or skin changes of chronic lymphedema.       ASSESSMENT AND PLAN:  1. Varicose veins with pain Recommend:  The patient is complaining of varicose veins.    I have had a long discussion with the patient regarding  varicose veins and why they cause symptoms.  Patient will begin wearing graduated compression stockings on a daily basis, beginning first thing in the morning and removing them in the evening. The patient is instructed specifically not to sleep in the stockings.    The patient  will also begin using over-the-counter analgesics such as Motrin 600 mg po TID to help control the symptoms as needed.    In addition, behavioral modification including elevation during the day will be initiated, utilizing a recliner was recommended.  The patient is also instructed to continue exercising such as walking 4-5 times per week.  At this time the patient will continue conservative therapy  The Patient will follow up PRN if the symptoms worsen.  2. Essential hypertension Good BP control, no medication changes needed.     Current Outpatient Medications on File Prior to Visit  Medication Sig Dispense Refill  . aspirin EC 81 MG tablet Take 81  mg by mouth at bedtime.     . baclofen (LIORESAL) 10 MG tablet Take 10 mg by mouth at bedtime.     . budesonide-formoterol (SYMBICORT) 160-4.5 MCG/ACT inhaler INHALE 2 PUFFS INTO THE LUNGS DAILY 10.2 g 12  . diphenhydramine-acetaminophen (TYLENOL PM) 25-500 MG TABS tablet Take by mouth.    . EPIPEN 2-PAK 0.3 MG/0.3ML SOAJ injection Inject 0.3 mLs into the skin as needed (allergic reactions).   1  . estradiol (ESTRACE) 0.5 MG tablet TAKE 1 TABLET(0.5 MG) BY MOUTH DAILY 90 tablet 0  . fexofenadine (ALLEGRA) 180 MG tablet Take 180 mg by mouth daily.    Marland Kitchen ipratropium (ATROVENT) 0.06 % nasal spray Place 2 sprays into both nostrils 2 (two) times daily.    . Probiotic Product (DIGESTIVE ADVANTAGE PO) Take 50,000 Units by mouth 2 (two) times daily at 8 am and 10 pm.     . rosuvastatin (CRESTOR) 10 MG tablet Take 1 tablet (10 mg total) by mouth daily. MUST KEEP APPOINTMENT 01/25/20 WITH DR CROITORU FOR FUTURE REFILLS 90 tablet 0  . albuterol (PROAIR HFA) 108 (90 Base) MCG/ACT inhaler Inhale 1-2 puffs into the lungs every 6 (six) hours as needed for wheezing or shortness of breath (and prior to exertion). (Patient not taking: Reported on 01/05/2020) 8.5 g 0   No current facility-administered medications on file prior to visit.    There are no Patient Instructions on file for this visit. No follow-ups on file.   Kris Hartmann, NP  This note was completed with Sales executive.  Any errors are purely unintentional.

## 2020-01-25 ENCOUNTER — Ambulatory Visit: Payer: Medicare Other | Admitting: Cardiovascular Disease

## 2020-02-19 ENCOUNTER — Other Ambulatory Visit: Payer: Self-pay

## 2020-02-19 MED ORDER — ROSUVASTATIN CALCIUM 10 MG PO TABS: 10.0000 mg | ORAL_TABLET | Freq: Every day | ORAL | 0 refills | Status: DC

## 2020-03-12 ENCOUNTER — Other Ambulatory Visit: Payer: Self-pay

## 2020-03-12 ENCOUNTER — Encounter: Payer: Self-pay | Admitting: Cardiovascular Disease

## 2020-03-12 ENCOUNTER — Ambulatory Visit (INDEPENDENT_AMBULATORY_CARE_PROVIDER_SITE_OTHER): Payer: Medicare Other | Admitting: Cardiovascular Disease

## 2020-03-12 VITALS — BP 118/56 | HR 62 | Ht 59.0 in | Wt 112.2 lb

## 2020-03-12 DIAGNOSIS — K224 Dyskinesia of esophagus: Secondary | ICD-10-CM

## 2020-03-12 DIAGNOSIS — E7800 Pure hypercholesterolemia, unspecified: Secondary | ICD-10-CM

## 2020-03-12 NOTE — Progress Notes (Signed)
Cardiology Office Note:    Date:  03/13/2020   ID:  Natalie [name], DOB February 11, 1946, MRN [id number]  PCP:  Kirk Ruths, MD  Cardiologist:  Sanda Klein, MD  Electrophysiologist:  None   Referring MD: Glendon Axe, MD   Chief Complaint  Patient presents with  . Follow-up    chest pain    History of Present Illness:    Natalie [name] is a 74 y.o. female with a family history of coronary artery disease with complaints of chest pain and dyspnea improved after diagnosis of eosinophilic esophagitis with spasm and appropriate therapy.  Coronary angiography in 2017 did not show meaningful coronary artery disease and she has normal left ventricular systolic function and normal heart filling pressures.  Her swallowing problems are substantially better this year.  She takes baclofen and omeprazole and her symptoms are largely controlled.  She has not had any recent problems with chest pain.  She has noticed a little dyspnea at times that improves if she uses Symbicort.  She has been able to exercise regularly with the group at the [facility], outside, even in cold weather.  She has not had any chest discomfort with activity.  She has gained back some of the weight that she lost in 2019, remains quite lean with a BMI of 22.  She has not been back to the pulmonary clinic since Dr. Alva Garnet left, but would like to reestablish follow-up there.  Past Medical History:  Diagnosis Date  . Allergy   . Chest pain   . Colon polyps   . Coronary artery disease   . Gastric ulcer   . GERD (gastroesophageal reflux disease)   . Hypertension   . Nasal polyps    followed by Dr Tami Ribas    Past Surgical History:  Procedure Laterality Date  . ABDOMINAL HYSTERECTOMY  1984   due to fibroids; have ovaries.   . APPENDECTOMY  1952  . CARDIAC CATHETERIZATION N/A 10/22/2016   Procedure: Right/Left Heart Cath and Coronary Angiography;  Surgeon: Troy Sine, MD;  Location: [place]  [facility];  Service: Cardiovascular;  Laterality: N/A;  . COLONOSCOPY WITH PROPOFOL N/A 03/03/2017   Procedure: COLONOSCOPY WITH PROPOFOL;  Surgeon: Manya Silvas, MD;  Location: [facility] ENDOSCOPY;  Service: Endoscopy;  Laterality: N/A;  . ESOPHAGOGASTRODUODENOSCOPY (EGD) WITH PROPOFOL N/A 08/12/2018   Procedure: ESOPHAGOGASTRODUODENOSCOPY (EGD) WITH PROPOFOL;  Surgeon: Manya Silvas, MD;  Location: [facility] ENDOSCOPY;  Service: Endoscopy;  Laterality: N/A;  . HEMORROIDECTOMY  1995  . NASAL SINUS SURGERY  1988, 2008  . TONSILLECTOMY  1950  . VAGINAL DELIVERY     x 3    Current Medications: Current Meds  Medication Sig  . aspirin EC 81 MG tablet Take 81 mg by mouth at bedtime.   . baclofen (LIORESAL) 10 MG tablet Take 10 mg by mouth at bedtime.   . budesonide-formoterol (SYMBICORT) 160-4.5 MCG/ACT inhaler INHALE 2 PUFFS INTO THE LUNGS DAILY  . diphenhydramine-acetaminophen (TYLENOL PM) 25-500 MG TABS tablet Take by mouth.  . EPIPEN 2-PAK 0.3 MG/0.3ML SOAJ injection Inject 0.3 mLs into the skin as needed (allergic reactions).   Natalie [name] estradiol (ESTRACE) 0.5 MG tablet TAKE 1 TABLET(0.5 MG) BY MOUTH DAILY  . fexofenadine (ALLEGRA) 180 MG tablet Take 180 mg by mouth daily.  Natalie [name] omeprazole (PRILOSEC) 20 MG capsule Take 20 mg by mouth 2 (two) times daily.  . Probiotic Product (DIGESTIVE ADVANTAGE PO) Take 50,000 Units by mouth 2 (two) times daily at 8 am  and 10 pm.   . rosuvastatin (CRESTOR) 10 MG tablet Take 1 tablet (10 mg total) by mouth daily. MUST KEEP APPOINTMENT 01/25/20 WITH DR Dala Breault FOR FUTURE REFILLS     Allergies:   Aspirin, Amoxicillin-pot clavulanate, Atorvastatin, Clindamycin/lincomycin, Codeine, Tramadol, Carisoprodol, and Clarithromycin   Social History   Socioeconomic History  . Marital status: Married    Spouse name: Not on file  . Number of children: 3  . Years of education: Not on file  . Highest education level: Not on file  Occupational History  . Occupation: Retired    Tobacco Use  . Smoking status: Never Smoker  . Smokeless tobacco: Never Used  Substance and Sexual Activity  . Alcohol use: No  . Drug use: No  . Sexual activity: Not Currently  Other Topics Concern  . Not on file  Social History Narrative   Pt lives with husband.   Social Determinants of Health   Financial Resource Strain:   . Difficulty of Paying Living Expenses:   Food Insecurity:   . Worried About Charity fundraiser in the Last Year:   . Arboriculturist in the Last Year:   Transportation Needs:   . Film/video editor (Medical):   Natalie [name] Lack of Transportation (Non-Medical):   Physical Activity:   . Days of Exercise per Week:   . Minutes of Exercise per Session:   Stress:   . Feeling of Stress :   Social Connections:   . Frequency of Communication with Friends and Family:   . Frequency of Social Gatherings with Friends and Family:   . Attends Religious Services:   . Active Member of Clubs or Organizations:   . Attends Archivist Meetings:   Natalie [name] Marital Status:      Family History: The patient's family history includes COPD in her paternal grandfather and sister; COPD (age of onset: 98) in her father; Cancer in her paternal grandmother; Cancer (age of onset: 84) in her daughter; Heart disease in her maternal grandfather and maternal grandmother; Heart disease (age of onset: 71) in her brother; Heart disease (age of onset: 6) in her mother; Pneumonia (age of onset: 40) in her sister. There is no history of Breast cancer.  ROS:   Please see the history of present illness.     All other systems reviewed and are negative.  EKGs/Labs/Other Studies Reviewed:    The following studies were reviewed today: Labs and notes from primary care provider.  EKG: The EKG ordered today shows normal sinus rhythm and is a normal tracing.  Recent Labs: Glucose 79, potassium 4.0, creatinine 0.7, normal liver function tests, albumin 4.1,  Recent Lipid Panel    Component  Value Date/Time   CHOL 214 (H) 03/10/2016 1019   TRIG 70.0 03/10/2016 1019   HDL 81.80 03/10/2016 1019   CHOLHDL 3 03/10/2016 1019   VLDL 14.0 03/10/2016 1019   LDLCALC 118 (H) 03/10/2016 1019   [name] 12, 2021 Total cholesterol 140, HDL 78, LDL 53, triglycerides 45 Creatinine 0.6, potassium 4.4, normal liver function tests  Physical Exam:    VS:  BP (!) 118/56   Pulse 62   Ht 4\' 11"  (1.499 m)   Wt 112 lb 3.2 oz (50.9 kg)   SpO2 98%   BMI 22.66 kg/m     Wt Readings from Last 3 Encounters:  03/12/20 112 lb 3.2 oz (50.9 kg)  01/05/20 109 lb (49.4 kg)  02/09/19 102 lb (46.3 kg)  GEN: Extremely lean, looks very fit, younger than stated age, well nourished, well developed in no acute distress HEENT: Normal NECK: No JVD; No carotid bruits LYMPHATICS: No lymphadenopathy CARDIAC: RRR, no murmurs, rubs, gallops RESPIRATORY:  Clear to auscultation without rales, wheezing or rhonchi  ABDOMEN: Soft, non-tender, non-distended MUSCULOSKELETAL:  No edema; No deformity  SKIN: Warm and dry NEUROLOGIC:  Alert and oriented x 3 PSYCHIATRIC:  Normal affect   ASSESSMENT:    1. Hypercholesterolemia   2. Esophageal spasm    PLAN:    In order of problems listed above:  1. Chest pain: Related to esophageal spasm/eosinophilic esophagitis.  No evidence of CAD. 2. HLP: Excellent lipid parameters on rosuvastatin.  Very healthy diet and lifestyle. 3. Reactive airway disease: Encouraged her to restart pulmonary clinic follow-up.  Offered "as needed follow-up" but she would prefer to keep coming on a yearly basis.  Her husband (herb) is also my patient.   Medication Adjustments/Labs and Tests Ordered: Current medicines are reviewed at length with the patient today.  Concerns regarding medicines are outlined above.  Orders Placed This Encounter  Procedures  . EKG 12-Lead   No orders of the defined types were placed in this encounter.   Patient Instructions  Medication  Instructions:  No changes *If you need a refill on your cardiac medications before your next appointment, please call your pharmacy*   Lab Work: None ordered If you have labs (blood work) drawn today and your tests are completely normal, you will receive your results only by: Natalie [name] MyChart Message (if you have MyChart) OR . A paper copy in the mail If you have any lab test that is abnormal or we need to change your treatment, we will call you to review the results.   Testing/Procedures: None ordered   Follow-Up: At [facility], you and your health needs are our priority.  As part of our continuing mission to provide you with exceptional heart care, we have created designated Provider Care Teams.  These Care Teams include your primary Cardiologist (physician) and Advanced Practice Providers (APPs -  Physician Assistants and Nurse Practitioners) who all work together to provide you with the care you need, when you need it.  We recommend signing up for the patient portal called "MyChart".  Sign up information is provided on this After Visit Summary.  MyChart is used to connect with patients for Virtual Visits (Telemedicine).  Patients are able to view lab/test results, encounter notes, upcoming appointments, etc.  Non-urgent messages can be sent to your provider as well.   To learn more about what you can do with MyChart, go to [email or web address].    Your next appointment:   12 month(s)  The format for your next appointment:   In Person  Provider:   You may see Sanda Klein, MD or one of the following Advanced Practice Providers on your designated Care Team:    Almyra Deforest, PA-C  Fabian Sharp, [place] or   Roby Lofts, PA-C       Signed, Sanda Klein, MD  03/13/2020 12:48 PM    [place]

## 2020-03-12 NOTE — Patient Instructions (Signed)

## 2020-03-13 ENCOUNTER — Encounter: Payer: Self-pay | Admitting: Cardiovascular Disease

## 2020-04-02 ENCOUNTER — Other Ambulatory Visit: Payer: Self-pay | Admitting: Internal Medicine

## 2020-04-02 DIAGNOSIS — M5416 Radiculopathy, lumbar region: Secondary | ICD-10-CM

## 2020-04-14 ENCOUNTER — Other Ambulatory Visit: Payer: Self-pay

## 2020-04-14 ENCOUNTER — Ambulatory Visit
Admission: RE | Admit: 2020-04-14 | Discharge: 2020-04-14 | Disposition: A | Discharge status: Home / Self Care | Payer: Medicare Other | Source: Ambulatory Visit | Attending: Internal Medicine | Admitting: Internal Medicine

## 2020-04-14 DIAGNOSIS — M5416 Radiculopathy, lumbar region: Secondary | ICD-10-CM | POA: Diagnosis present

## 2020-06-03 ENCOUNTER — Other Ambulatory Visit: Payer: Self-pay

## 2020-06-03 MED ORDER — ROSUVASTATIN CALCIUM 10 MG PO TABS: 10.0000 mg | ORAL_TABLET | Freq: Every day | ORAL | 0 refills | Status: DC

## 2020-09-08 ENCOUNTER — Other Ambulatory Visit: Payer: Self-pay | Admitting: Cardiovascular Disease

## 2020-12-21 ENCOUNTER — Other Ambulatory Visit: Payer: Self-pay | Admitting: Cardiovascular Disease

## 2021-02-10 ENCOUNTER — Other Ambulatory Visit: Payer: Self-pay | Admitting: Student

## 2021-02-10 DIAGNOSIS — M47812 Spondylosis without myelopathy or radiculopathy, cervical region: Secondary | ICD-10-CM

## 2021-02-10 DIAGNOSIS — M40202 Unspecified kyphosis, cervical region: Secondary | ICD-10-CM

## 2021-02-27 ENCOUNTER — Other Ambulatory Visit: Payer: Self-pay

## 2021-02-27 ENCOUNTER — Ambulatory Visit
Admission: RE | Admit: 2021-02-27 | Discharge: 2021-02-27 | Disposition: A | Discharge status: Home / Self Care | Payer: Medicare Other | Source: Ambulatory Visit | Attending: Student | Admitting: Student

## 2021-02-27 DIAGNOSIS — M47812 Spondylosis without myelopathy or radiculopathy, cervical region: Secondary | ICD-10-CM | POA: Insufficient documentation

## 2021-02-27 DIAGNOSIS — M40202 Unspecified kyphosis, cervical region: Secondary | ICD-10-CM | POA: Diagnosis present

## 2021-03-24 ENCOUNTER — Telehealth: Payer: Self-pay | Admitting: Cardiovascular Disease

## 2021-03-24 ENCOUNTER — Other Ambulatory Visit: Payer: Self-pay | Admitting: Cardiovascular Disease

## 2021-03-24 NOTE — Telephone Encounter (Signed)
4.11.22 LVM on cell to sch fu appt w/Dr. Sallyanne Kuster. LP

## 2021-05-08 ENCOUNTER — Ambulatory Visit: Payer: Medicare Other | Admitting: Cardiovascular Disease

## 2021-06-17 NOTE — Progress Notes (Addendum)
Cardiology Office Note:    Date:  06/19/2021   ID:  Natalie Natalie [name], DOB 08/23/1946, MRN [id number]  PCP:  Natalie Ruths, MD  Cardiologist:  Natalie Klein, MD  Electrophysiologist:  None   Referring MD: Natalie Ruths, MD   Chief Complaint  Patient presents with   Shortness of Breath     History of Present Illness:    Natalie Natalie [name] is a 75 y.o. female with a family history of coronary artery disease with complaints of chest pain and dyspnea improved after diagnosis of eosinophilic esophagitis with spasm and appropriate therapy.  Coronary angiography in 2017 did not show meaningful coronary artery disease and she has normal left ventricular systolic function and normal heart filling pressures.  She remains very physically active.  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  She describes her self as "very short of breath, but have always been so" ever since youth.  She is able to put in daily moderately intense exercise without limitations.  On exam today she has frequent ectopic beats but is completely unaware of them.  Does not have a history of atrial fibrillation, but her sister had atrial fibrillation so she is aware of this problem.  She recalls wearing a rhythm monitor through the [name] clinic, but I can find no record of this in care everywhere.  She does not have dysphagia or chest pain recently.  She is maintaining a healthy weight with a BMI of 22.  She has not been back to the pulmonary clinic since Dr. Alva Natalie [name] left.  She tries to avoid using albuterol since this makes her very jittery.  She does use a long-acting bronchodilator, but does not take this as prescribed twice daily.  Past Medical History:  Diagnosis Date   Allergy    Chest pain    Colon polyps    Coronary artery disease    Gastric ulcer    GERD (gastroesophageal reflux disease)    Hypertension    Nasal polyps    followed by Dr Natalie Natalie [name]    Past  Surgical History:  Procedure Laterality Date   ABDOMINAL HYSTERECTOMY  1984   due to fibroids; have ovaries.    APPENDECTOMY  1952   CARDIAC CATHETERIZATION N/A 10/22/2016   Procedure: Right/Left Heart Cath and Coronary Angiography;  Surgeon: Natalie Sine, MD;  Location: [place] [facility];  Service: Cardiovascular;  Laterality: N/A;   COLONOSCOPY WITH PROPOFOL N/A 03/03/2017   Procedure: COLONOSCOPY WITH PROPOFOL;  Surgeon: Natalie Silvas, MD;  Location: [facility] ENDOSCOPY;  Service: Endoscopy;  Laterality: N/A;   ESOPHAGOGASTRODUODENOSCOPY (EGD) WITH PROPOFOL N/A 08/12/2018   Procedure: ESOPHAGOGASTRODUODENOSCOPY (EGD) WITH PROPOFOL;  Surgeon: Natalie Silvas, MD;  Location: [facility] ENDOSCOPY;  Service: Endoscopy;  Laterality: N/A;   [place], 2008   TONSILLECTOMY  1950   VAGINAL DELIVERY     x 3    Current Medications: Current Meds  Medication Sig   aspirin EC 81 MG tablet Take 81 mg by mouth at bedtime.    baclofen (LIORESAL) 10 MG tablet Take 10 mg by mouth at bedtime.    budesonide-formoterol (SYMBICORT) 160-4.5 MCG/ACT inhaler INHALE 2 PUFFS INTO THE LUNGS DAILY   diphenhydramine-acetaminophen (TYLENOL PM) 25-500 MG TABS tablet Take by mouth.   EPIPEN 2-PAK 0.3 MG/0.3ML SOAJ injection Inject 0.3 mLs into the skin as needed (allergic reactions).    estradiol (ESTRACE) 0.5 MG tablet TAKE 1  TABLET(0.5 MG) BY MOUTH DAILY   fexofenadine (ALLEGRA) 180 MG tablet Take 180 mg by mouth daily.   omeprazole (PRILOSEC) 20 MG capsule Take 20 mg by mouth 2 (two) times daily.   Probiotic Product (DIGESTIVE ADVANTAGE PO) Take 50,000 Units by mouth 2 (two) times daily at 8 am and 10 pm.    rosuvastatin (CRESTOR) 10 MG tablet TAKE 1 TABLET(10 MG) BY MOUTH DAILY   vitamin B-12 (CYANOCOBALAMIN) 1000 MCG tablet Take 1,000 mcg by mouth daily.     Allergies:   Aspirin, Amoxicillin-pot clavulanate, Atorvastatin, Clindamycin/lincomycin, Codeine, Tramadol,  Carisoprodol, and Clarithromycin   Social History   Socioeconomic History   Marital status: Married    Spouse name: Not on file   Number of children: 3   Years of education: Not on file   Highest education level: Not on file  Occupational History   Occupation: Retired  Tobacco Use   Smoking status: Never   Smokeless tobacco: Never  Vaping Use   Vaping Use: Never used  Substance and Sexual Activity   Alcohol use: No   Drug use: No   Sexual activity: Not Currently  Other Topics Concern   Not on file  Social History Narrative   Pt lives with husband.   Social Determinants of Health   Financial Resource Strain: Not on file  Food Insecurity: Not on file  Transportation Needs: Not on file  Physical Activity: Not on file  Stress: Not on file  Social Connections: Not on file     Family History: The patient's family history includes COPD in her paternal grandfather and sister; COPD (age of onset: 53) in her father; Cancer in her paternal grandmother; Cancer (age of onset: 82) in her daughter; Heart disease in her maternal grandfather and maternal grandmother; Heart disease (age of onset: 2) in her brother; Heart disease (age of onset: 75) in her mother; Pneumonia (age of onset: 70) in her sister. There is no history of Breast cancer.  ROS:   Please see the history of present illness.     All other systems reviewed and are negative.  EKGs/Labs/Other Studies Reviewed:    The following studies were reviewed today: Labs and notes from primary care provider.  EKG: The EKG ordered today shows normal sinus rhythm and is a normal tracing.  Recent Labs: Glucose 79, potassium 4.0, creatinine 0.7, normal liver function tests, albumin 4.1,  Recent Lipid Panel    Component Value Date/Time   CHOL 214 (H) 03/10/2016 1019   TRIG 70.0 03/10/2016 1019   HDL 81.80 03/10/2016 1019   CHOLHDL 3 03/10/2016 1019   VLDL 14.0 03/10/2016 1019   LDLCALC 118 (H) 03/10/2016 1019   Natalie [name] 12,  2021 Total cholesterol 140, HDL 78, LDL 53, triglycerides 45 Creatinine 0.6, potassium 4.4, normal liver function tests  Physical Exam:    VS:  BP 120/76 (BP Location: Right Arm)   Pulse 61   Ht 4\' 11"  (1.499 m)   Wt 109 lb 9.6 oz (49.7 kg)   BMI 22.14 kg/m     Wt Readings from Last 3 Encounters:  06/19/21 109 lb 9.6 oz (49.7 kg)  03/12/20 112 lb 3.2 oz (50.9 kg)  01/05/20 109 lb (49.4 kg)     General: Alert, oriented x3, no distress, very lean and fit, appears younger than stated age Head: no evidence of trauma, PERRL, EOMI, no exophtalmos or lid lag, no myxedema, no xanthelasma; normal ears, nose and oropharynx Neck: normal jugular venous pulsations and no  hepatojugular reflux; brisk carotid pulses without delay and no carotid bruits Chest: clear to auscultation, no signs of consolidation by percussion or palpation, normal fremitus, symmetrical and full respiratory excursions Cardiovascular: normal position and quality of the apical impulse, regular rhythm with frequent ectopic beats, normal first and second heart sounds, no murmurs, rubs or gallops Abdomen: no tenderness or distention, no masses by palpation, no abnormal pulsatility or arterial bruits, normal bowel sounds, no hepatosplenomegaly Extremities: no clubbing, cyanosis or edema; 2+ radial, ulnar and brachial pulses bilaterally; 2+ right femoral, posterior tibial and dorsalis pedis pulses; 2+ left femoral, posterior tibial and dorsalis pedis pulses; no subclavian or femoral bruits Neurological: grossly nonfocal Psych: Normal mood and affect   ASSESSMENT:    1. Precordial pain   2. Hypercholesterolemia   3. Reactive airway disease without complication, unspecified asthma severity, unspecified whether persistent   4. Ectopic beats     PLAN:    In order of problems listed above:  Chest pain: This has resolved and it was related to esophageal spasm/eosinophilic esophagitis.  No evidence of CAD by coronary angiography  in 2017. HLP: Most recent lipid parameters are excellent on rosuvastatin.  Very healthy diet and lifestyle. Reactive airway disease: Encouraged her to restart pulmonary clinic follow-up. Ectopy on physical exam: Did not catch any arrhythmia on the EKG today.  She is unaware of this.  It's not unreasonable to perform some type of monitoring to make sure she is not having occult atrial fibrillation that could explain her exertional dyspnea.  She believes she's already had some type of arrhythmia monitoring and we will try to retrieve this first before ordering a 14-day event monitor   Medication Adjustments/Labs and Tests Ordered: Current medicines are reviewed at length with the patient today.  Concerns regarding medicines are outlined above.  Orders Placed This Encounter  Procedures   EKG 12-Lead    No orders of the defined types were placed in this encounter.   Patient Instructions  Medication Instructions:  No changes *If you need a refill on your cardiac medications before your next appointment, please call your pharmacy*   Lab Work: None ordered If you have labs (blood work) drawn today and your tests are completely normal, you will receive your results only by: [place] (if you have MyChart) OR A paper copy in the mail If you have any lab test that is abnormal or we need to change your treatment, we will call you to review the results.   Testing/Procedures: None ordered   Follow-Up: At [facility], you and your health needs are our priority.  As part of our continuing mission to provide you with exceptional heart care, we have created designated Provider Care Teams.  These Care Teams include your primary Cardiologist (physician) and Advanced Practice Providers (APPs -  Physician Assistants and Nurse Practitioners) who all work together to provide you with the care you need, when you need it.  We recommend signing up for the patient portal called "MyChart".  Sign up  information is provided on this After Visit Summary.  MyChart is used to connect with patients for Virtual Visits (Telemedicine).  Patients are able to view lab/test results, encounter notes, upcoming appointments, etc.  Non-urgent messages can be sent to your provider as well.   To learn more about what you can do with MyChart, go to [email or web address].    Your next appointment:   12 month(s)  The format for your next appointment:   In Person  Provider:  You may see Natalie Klein, MD or one of the following Advanced Practice Providers on your designated Care Team:   Almyra Deforest, PA-C Fabian Sharp, [place] or  Roby Lofts, PA-C     Signed, Natalie Klein, MD  06/19/2021 7:50 PM    [place]

## 2021-06-19 ENCOUNTER — Encounter: Payer: Self-pay | Admitting: Cardiovascular Disease

## 2021-06-19 ENCOUNTER — Other Ambulatory Visit: Payer: Self-pay

## 2021-06-19 ENCOUNTER — Ambulatory Visit (INDEPENDENT_AMBULATORY_CARE_PROVIDER_SITE_OTHER): Payer: Medicare Other | Admitting: Cardiovascular Disease

## 2021-06-19 VITALS — BP 120/76 | HR 61 | Ht 59.0 in | Wt 109.6 lb

## 2021-06-19 DIAGNOSIS — R072 Precordial pain: Secondary | ICD-10-CM | POA: Diagnosis not present

## 2021-06-19 DIAGNOSIS — I4949 Other premature depolarization: Secondary | ICD-10-CM

## 2021-06-19 DIAGNOSIS — E7800 Pure hypercholesterolemia, unspecified: Secondary | ICD-10-CM | POA: Diagnosis not present

## 2021-06-19 DIAGNOSIS — J45909 Unspecified asthma, uncomplicated: Secondary | ICD-10-CM

## 2021-06-19 NOTE — Patient Instructions (Signed)

## 2021-06-20 ENCOUNTER — Encounter: Payer: Self-pay | Admitting: *Deleted

## 2021-06-29 ENCOUNTER — Other Ambulatory Visit: Payer: Self-pay | Admitting: Cardiovascular Disease

## 2021-09-01 ENCOUNTER — Other Ambulatory Visit: Payer: Self-pay | Admitting: Internal Medicine

## 2021-09-01 DIAGNOSIS — Z1231 Encounter for screening mammogram for malignant neoplasm of breast: Secondary | ICD-10-CM

## 2021-09-29 ENCOUNTER — Telehealth: Payer: Self-pay | Admitting: Cardiovascular Disease

## 2021-09-29 MED ORDER — ROSUVASTATIN CALCIUM 10 MG PO TABS: ORAL_TABLET | ORAL | 3 refills | Status: DC

## 2021-09-29 NOTE — Telephone Encounter (Signed)
*  STAT* If patient is at the pharmacy, call can be transferred to refill team.   1. Which medications need to be refilled? (please list name of each medication and dose if known) rosuvastatin (CRESTOR) 10 MG tablet  2. Which pharmacy/location (including street and city if local pharmacy) is medication to be sent to? [facility]  3. Do they need a 30 day or 90 day supply? 90 day  SHE IS OUT OF MEDICATION

## 2021-10-02 ENCOUNTER — Other Ambulatory Visit: Payer: Self-pay

## 2021-10-02 ENCOUNTER — Ambulatory Visit
Admission: RE | Admit: 2021-10-02 | Discharge: 2021-10-02 | Disposition: A | Discharge status: Home / Self Care | Payer: Medicare Other | Source: Ambulatory Visit | Attending: Internal Medicine | Admitting: Internal Medicine

## 2021-10-02 DIAGNOSIS — Z1231 Encounter for screening mammogram for malignant neoplasm of breast: Secondary | ICD-10-CM | POA: Diagnosis present

## 2021-10-07 ENCOUNTER — Other Ambulatory Visit: Payer: Self-pay | Admitting: Cardiovascular Disease

## 2022-02-16 ENCOUNTER — Other Ambulatory Visit: Payer: Self-pay | Admitting: Unknown Physician Specialty

## 2022-02-16 DIAGNOSIS — H93A1 Pulsatile tinnitus, right ear: Secondary | ICD-10-CM

## 2022-02-23 ENCOUNTER — Other Ambulatory Visit: Payer: Medicare Other

## 2022-02-24 ENCOUNTER — Other Ambulatory Visit: Payer: Medicare Other

## 2022-02-25 ENCOUNTER — Other Ambulatory Visit: Payer: Self-pay | Admitting: Urology

## 2022-02-25 ENCOUNTER — Ambulatory Visit
Admission: RE | Admit: 2022-02-25 | Discharge: 2022-02-25 | Disposition: A | Discharge status: Home / Self Care | Payer: Medicare Other | Source: Ambulatory Visit | Attending: Unknown Physician Specialty | Admitting: Unknown Physician Specialty

## 2022-02-25 DIAGNOSIS — H93A1 Pulsatile tinnitus, right ear: Secondary | ICD-10-CM

## 2022-03-04 ENCOUNTER — Other Ambulatory Visit: Payer: Self-pay | Admitting: Unknown Physician Specialty

## 2022-03-04 DIAGNOSIS — H93A1 Pulsatile tinnitus, right ear: Secondary | ICD-10-CM

## 2022-03-12 ENCOUNTER — Ambulatory Visit
Admission: RE | Admit: 2022-03-12 | Discharge: 2022-03-12 | Disposition: A | Discharge status: Home / Self Care | Payer: Medicare Other | Source: Ambulatory Visit | Attending: Unknown Physician Specialty | Admitting: Unknown Physician Specialty

## 2022-03-12 DIAGNOSIS — H93A1 Pulsatile tinnitus, right ear: Secondary | ICD-10-CM

## 2022-07-02 ENCOUNTER — Other Ambulatory Visit: Payer: Self-pay | Admitting: Cardiovascular Disease

## 2022-07-16 ENCOUNTER — Encounter: Payer: Self-pay | Admitting: Cardiovascular Disease

## 2022-07-20 MED ORDER — ROSUVASTATIN CALCIUM 10 MG PO TABS: 10.0000 mg | ORAL_TABLET | ORAL | 1 refills | Status: DC

## 2022-09-03 ENCOUNTER — Other Ambulatory Visit: Payer: Self-pay | Admitting: Internal Medicine

## 2022-09-03 DIAGNOSIS — Z1231 Encounter for screening mammogram for malignant neoplasm of breast: Secondary | ICD-10-CM

## 2022-10-06 ENCOUNTER — Ambulatory Visit
Admission: RE | Admit: 2022-10-06 | Discharge: 2022-10-06 | Disposition: A | Discharge status: Home / Self Care | Payer: Medicare Other | Source: Ambulatory Visit | Attending: Internal Medicine | Admitting: Internal Medicine

## 2022-10-06 DIAGNOSIS — Z1231 Encounter for screening mammogram for malignant neoplasm of breast: Secondary | ICD-10-CM

## 2022-10-12 ENCOUNTER — Encounter (INDEPENDENT_AMBULATORY_CARE_PROVIDER_SITE_OTHER): Payer: Self-pay

## 2022-12-01 ENCOUNTER — Encounter: Payer: Self-pay | Admitting: Cardiovascular Disease

## 2022-12-01 ENCOUNTER — Ambulatory Visit: Payer: Medicare Other | Attending: Cardiovascular Disease | Admitting: Cardiovascular Disease

## 2022-12-01 VITALS — BP 124/62 | HR 54 | Ht 59.0 in | Wt 108.4 lb

## 2022-12-01 DIAGNOSIS — E7800 Pure hypercholesterolemia, unspecified: Secondary | ICD-10-CM | POA: Insufficient documentation

## 2022-12-01 DIAGNOSIS — I10 Essential (primary) hypertension: Secondary | ICD-10-CM

## 2022-12-01 NOTE — Progress Notes (Signed)
Cardiology Office Note:    Date:  12/08/2022   ID:  Deborah Lazcano, DOB 01/07/1946, MRN [id number]  PCP:  Kirk Ruths, MD  Cardiologist:  Sanda Klein, MD  Electrophysiologist:  None   Referring MD: Kirk Ruths, MD   No chief complaint on file.    History of Present Illness:    Natalie [name] is a 76 y.o. female with a family history of coronary artery disease with complaints of chest pain and dyspnea improved after diagnosis of eosinophilic esophagitis with spasm and appropriate therapy.  Coronary angiography in 2017 did not show meaningful coronary artery disease and she has normal left ventricular systolic function and normal heart filling pressures.  She is very physically active.  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  The patient specifically denies any chest pain at rest or with exertion, dyspnea at rest or with exertion, orthopnea, paroxysmal nocturnal dyspnea, syncope, palpitations, focal neurological deficits, intermittent claudication, lower extremity edema, unexplained weight gain, cough, hemoptysis or wheezing.   Recent labs performed 10/07/2022 at the [facility] showed cholesterol 145, triglycerides 65, LDL 54, HDL 78.    Past Medical History:  Diagnosis Date   Allergy    Chest pain    Colon polyps    Coronary artery disease    Gastric ulcer    GERD (gastroesophageal reflux disease)    Hypertension    Nasal polyps    followed by Dr Tami Ribas    Past Surgical History:  Procedure Laterality Date   ABDOMINAL HYSTERECTOMY  1984   due to fibroids; have ovaries.    APPENDECTOMY  1952   CARDIAC CATHETERIZATION N/A 10/22/2016   Procedure: Right/Left Heart Cath and Coronary Angiography;  Surgeon: Troy Sine, MD;  Location: [place] [facility];  Service: Cardiovascular;  Laterality: N/A;   COLONOSCOPY WITH PROPOFOL N/A 03/03/2017    Procedure: COLONOSCOPY WITH PROPOFOL;  Surgeon: Manya Silvas, MD;  Location: [facility] ENDOSCOPY;  Service: Endoscopy;  Laterality: N/A;   ESOPHAGOGASTRODUODENOSCOPY (EGD) WITH PROPOFOL N/A 08/12/2018   Procedure: ESOPHAGOGASTRODUODENOSCOPY (EGD) WITH PROPOFOL;  Surgeon: Manya Silvas, MD;  Location: [facility] ENDOSCOPY;  Service: Endoscopy;  Laterality: N/A;   [place], 2008   TONSILLECTOMY  1950   VAGINAL DELIVERY     x 3    Current Medications: Current Meds  Medication Sig   aspirin EC 81 MG tablet Take 81 mg by mouth at bedtime.    Azelastine HCl 137 MCG/SPRAY SOLN Place 2 sprays into both nostrils 2 (two) times daily.   baclofen (LIORESAL) 10 MG tablet Take 10 mg by mouth at bedtime.    budesonide-formoterol (SYMBICORT) 160-4.5 MCG/ACT inhaler INHALE 2 PUFFS INTO THE LUNGS DAILY   Cholecalciferol (VITAMIN D3) 25 MCG (1000 UT) CAPS Take 1,000 Units by mouth daily in the afternoon.   diphenhydramine-acetaminophen (TYLENOL PM) 25-500 MG TABS tablet Take by mouth at bedtime.   estradiol (ESTRACE) 0.5 MG tablet TAKE 1 TABLET(0.5 MG) BY MOUTH DAILY   fexofenadine (ALLEGRA) 180 MG tablet Take 180 mg by mouth daily.   fluticasone (FLONASE) 50 MCG/ACT nasal spray Place 2 sprays into both nostrils daily.   omeprazole (PRILOSEC) 20 MG capsule Take 20 mg by mouth 2 (two) times daily.   rosuvastatin (CRESTOR) 10 MG tablet Take 1 tablet (10 mg total) by mouth 2 (two) times a week.   vitamin  B-12 (CYANOCOBALAMIN) 1000 MCG tablet Take 1,000 mcg by mouth daily.     Allergies:   Aspirin, Amoxicillin-pot clavulanate, Atorvastatin, Clindamycin/lincomycin, Codeine, Tramadol, Carisoprodol, and Clarithromycin   Social History   Socioeconomic History   Marital status: Married    Spouse name: Not on file   Number of children: 3   Years of education: Not on file   Highest education level: Not on file  Occupational History   Occupation: Retired  Tobacco Use    Smoking status: Never   Smokeless tobacco: Never  Vaping Use   Vaping Use: Never used  Substance and Sexual Activity   Alcohol use: No   Drug use: No   Sexual activity: Not Currently  Other Topics Concern   Not on file  Social History Narrative   Pt lives with husband.   Social Determinants of Health   Financial Resource Strain: Not on file  Food Insecurity: Not on file  Transportation Needs: Not on file  Physical Activity: Not on file  Stress: Not on file  Social Connections: Not on file     Family History: The patient's family history includes COPD in her paternal grandfather and sister; COPD (age of onset: 63) in her father; Cancer in her paternal grandmother; Cancer (age of onset: 49) in her daughter; Heart disease in her maternal grandfather and maternal grandmother; Heart disease (age of onset: 35) in her brother; Heart disease (age of onset: 72) in her mother; Pneumonia (age of onset: 25) in her sister. There is no history of Breast cancer.  ROS:   Please see the history of present illness.     All other systems reviewed and are negative.  EKGs/Labs/Other Studies Reviewed:    The following studies were reviewed today: Labs and notes from primary care provider.  EKG: The EKG ordered today shows mild sinus bradycardia and is a normal tracing.  Recent Labs: Glucose 79, potassium 4.0, creatinine 0.7, normal liver function tests, albumin 4.1,  Recent Lipid Panel    Component Value Date/Time   CHOL 214 (H) 03/10/2016 1019   TRIG 70.0 03/10/2016 1019   HDL 81.80 03/10/2016 1019   CHOLHDL 3 03/10/2016 1019   VLDL 14.0 03/10/2016 1019   LDLCALC 118 (H) 03/10/2016 1019   December 26, 2019 Total cholesterol 140, HDL 78, LDL 53, triglycerides 45 Creatinine 0.6, potassium 4.4, normal liver function tests  October 07, 2022 cholesterol 145, triglycerides 65, LDL 54, HDL 78. Potassium 4.3, creatinine 0.7, normal liver function test, TSH 1.47, hemoglobin 11.0  Physical  Exam:    VS:  BP 124/62 (BP Location: Left Arm, Patient Position: Sitting, Cuff Size: Small)   Pulse (!) 54   Ht '4\' 11"'$  (1.499 m)   Wt 108 lb 6.4 oz (49.2 kg)   BMI 21.89 kg/m     Wt Readings from Last 3 Encounters:  12/01/22 108 lb 6.4 oz (49.2 kg)  06/19/21 109 lb 9.6 oz (49.7 kg)  03/12/20 112 lb 3.2 oz (50.9 kg)      General: Alert, oriented x3, no distress, very lean and fit and appears younger than stated age Head: no evidence of trauma, PERRL, EOMI, no exophtalmos or lid lag, no myxedema, no xanthelasma; normal ears, nose and oropharynx Neck: normal jugular venous pulsations and no hepatojugular reflux; brisk carotid pulses without delay and no carotid bruits Chest: clear to auscultation, no signs of consolidation by percussion or palpation, normal fremitus, symmetrical and full respiratory excursions Cardiovascular: normal position and quality of the apical impulse,  regular rhythm, normal first and second heart sounds, no murmurs, rubs or gallops Abdomen: no tenderness or distention, no masses by palpation, no abnormal pulsatility or arterial bruits, normal bowel sounds, no hepatosplenomegaly Extremities: no clubbing, cyanosis or edema; 2+ radial, ulnar and brachial pulses bilaterally; 2+ right femoral, posterior tibial and dorsalis pedis pulses; 2+ left femoral, posterior tibial and dorsalis pedis pulses; no subclavian or femoral bruits Neurological: grossly nonfocal Psych: Normal mood and affect    ASSESSMENT:    1. Hypercholesterolemia      PLAN:    In order of problems listed above:  Chest pain: No recent complaints.  This has resolved and it was related to esophageal spasm/eosinophilic esophagitis.  No evidence of CAD by coronary angiography in 2017. HLP: Excellent lipid parameters on rosuvastatin with healthy diet and active lifestyle.    Medication Adjustments/Labs and Tests Ordered: Current medicines are reviewed at length with the patient today.  Concerns  regarding medicines are outlined above.  Orders Placed This Encounter  Procedures   EKG 12-Lead    No orders of the defined types were placed in this encounter.    Patient Instructions  Medication Instructions:  The current medical regimen is effective;  continue present plan and medications.  *If you need a refill on your cardiac medications before your next appointment, please call your pharmacy*   Follow-Up: At [facility], you and your health needs are our priority.  As part of our continuing mission to provide you with exceptional heart care, we have created designated Provider Care Teams.  These Care Teams include your primary Cardiologist (physician) and Advanced Practice Providers (APPs -  Physician Assistants and Nurse Practitioners) who all work together to provide you with the care you need, when you need it.  We recommend signing up for the patient portal called "MyChart".  Sign up information is provided on this After Visit Summary.  MyChart is used to connect with patients for Virtual Visits (Telemedicine).  Patients are able to view lab/test results, encounter notes, upcoming appointments, etc.  Non-urgent messages can be sent to your provider as well.   To learn more about what you can do with MyChart, go to [email or web address].    Your next appointment:   12 month(s)  The format for your next appointment:   In Person  Provider:   Sanda Klein, MD            Signed, Sanda Klein, MD  12/08/2022 7:22 PM    [place]

## 2022-12-01 NOTE — Patient Instructions (Signed)

## 2023-01-25 ENCOUNTER — Ambulatory Visit: Admit: 2023-01-25 | Payer: Medicare Other | Admitting: Gastroenterology

## 2023-01-25 SURGERY — COLONOSCOPY
Anesthesia: General

## 2023-02-16 ENCOUNTER — Encounter: Payer: Self-pay | Admitting: Internal Medicine

## 2023-02-17 ENCOUNTER — Ambulatory Visit: Payer: Medicare Other | Admitting: Anesthesiology

## 2023-02-17 ENCOUNTER — Encounter
Admission: RE | Disposition: A | Discharge status: Home / Self Care | Payer: Self-pay | Source: Home / Self Care | Attending: Internal Medicine

## 2023-02-17 ENCOUNTER — Ambulatory Visit
Admission: RE | Admit: 2023-02-17 | Discharge: 2023-02-17 | Disposition: A | Discharge status: Home / Self Care | Payer: Medicare Other | Attending: Internal Medicine | Admitting: Internal Medicine

## 2023-02-17 DIAGNOSIS — Z1211 Encounter for screening for malignant neoplasm of colon: Secondary | ICD-10-CM | POA: Diagnosis not present

## 2023-02-17 DIAGNOSIS — K642 Third degree hemorrhoids: Secondary | ICD-10-CM | POA: Insufficient documentation

## 2023-02-17 DIAGNOSIS — Z86010 Personal history of colonic polyps: Secondary | ICD-10-CM | POA: Diagnosis not present

## 2023-02-17 DIAGNOSIS — I2510 Atherosclerotic heart disease of native coronary artery without angina pectoris: Secondary | ICD-10-CM | POA: Diagnosis not present

## 2023-02-17 DIAGNOSIS — K5730 Diverticulosis of large intestine without perforation or abscess without bleeding: Secondary | ICD-10-CM | POA: Insufficient documentation

## 2023-02-17 DIAGNOSIS — I10 Essential (primary) hypertension: Secondary | ICD-10-CM | POA: Insufficient documentation

## 2023-02-17 DIAGNOSIS — Z09 Encounter for follow-up examination after completed treatment for conditions other than malignant neoplasm: Secondary | ICD-10-CM | POA: Diagnosis not present

## 2023-02-17 DIAGNOSIS — K219 Gastro-esophageal reflux disease without esophagitis: Secondary | ICD-10-CM | POA: Insufficient documentation

## 2023-02-17 DIAGNOSIS — J449 Chronic obstructive pulmonary disease, unspecified: Secondary | ICD-10-CM | POA: Diagnosis not present

## 2023-02-17 DIAGNOSIS — K648 Other hemorrhoids: Secondary | ICD-10-CM | POA: Insufficient documentation

## 2023-02-17 HISTORY — PX: COLONOSCOPY: SHX5424

## 2023-02-17 SURGERY — COLONOSCOPY
Anesthesia: General

## 2023-02-17 MED ORDER — PROPOFOL 10 MG/ML IV BOLUS
INTRAVENOUS | Status: DC | PRN
  Administered 2023-02-17: 50 mg via INTRAVENOUS

## 2023-02-17 MED ORDER — PROPOFOL 500 MG/50ML IV EMUL
INTRAVENOUS | Status: DC | PRN
  Administered 2023-02-17: 120 ug/kg/min via INTRAVENOUS

## 2023-02-17 MED ORDER — GLYCOPYRROLATE 0.2 MG/ML IJ SOLN
INTRAMUSCULAR | Status: DC | PRN
  Administered 2023-02-17: .2 mg via INTRAVENOUS

## 2023-02-17 MED ORDER — SODIUM CHLORIDE 0.9 % IV SOLN: INTRAVENOUS | Status: DC

## 2023-02-17 NOTE — Transfer of Care (Signed)
Immediate Anesthesia Transfer of Care Note  Patient: Natalie [name]  Procedure(s) Performed: COLONOSCOPY  Patient Location: PACU  Anesthesia Type:General  Level of Consciousness: awake, alert , and oriented  Airway & Oxygen Therapy: Patient Spontanous Breathing  Post-op Assessment: Report given to RN and Post -op Vital signs reviewed and stable  Post vital signs: Reviewed and stable  Last Vitals:  Vitals Value Taken Time  BP 91/55 02/17/23 1255  Temp    Pulse 78 02/17/23 1256  Resp 16 02/17/23 1256  SpO2 99 % 02/17/23 1256  Vitals shown include unvalidated device data.  Last Pain:  Vitals:   02/17/23 1209  TempSrc: Temporal  PainSc: 0-No pain         Complications: No notable events documented.

## 2023-02-17 NOTE — Anesthesia Preprocedure Evaluation (Signed)
Anesthesia Evaluation  Patient identified by MRN, date of birth, ID band Patient awake    Reviewed: Allergy & Precautions, NPO status , Patient's Chart, lab work & pertinent test results  Airway Mallampati: II  TM Distance: >3 FB Neck ROM: full    Dental  (+) Teeth Intact   Pulmonary neg pulmonary ROS, COPD,  COPD inhaler   Pulmonary exam normal breath sounds clear to auscultation       Cardiovascular Exercise Tolerance: Good hypertension, Pt. on medications negative cardio ROS Normal cardiovascular exam Rhythm:Regular     Neuro/Psych negative neurological ROS  negative psych ROS   GI/Hepatic negative GI ROS, Neg liver ROS, PUD,GERD  Medicated,,  Endo/Other  negative endocrine ROS    Renal/GU negative Renal ROS  negative genitourinary   Musculoskeletal negative musculoskeletal ROS (+)    Abdominal Normal abdominal exam  (+)   Peds negative pediatric ROS (+)  Hematology negative hematology ROS (+)   Anesthesia Other Findings Past Medical History: No date: Allergy No date: Chest pain No date: Colon polyps No date: Coronary artery disease No date: Gastric ulcer No date: GERD (gastroesophageal reflux disease) No date: Hypertension No date: Nasal polyps     Comment:  followed by Dr Tami Ribas  Past Surgical History: 1984: ABDOMINAL HYSTERECTOMY     Comment:  due to fibroids; have ovaries.  1952: APPENDECTOMY 10/22/2016: CARDIAC CATHETERIZATION; N/A     Comment:  Procedure: Right/Left Heart Cath and Coronary               Angiography;  Surgeon: Troy Sine, MD;  Location: [place] [facility];  Service: Cardiovascular;  Laterality:               N/A; 03/03/2017: COLONOSCOPY WITH PROPOFOL; N/A     Comment:  Procedure: COLONOSCOPY WITH PROPOFOL;  Surgeon: Manya Silvas, MD;  Location: [facility] ENDOSCOPY;  Service:               Endoscopy;  Laterality: N/A; 08/12/2018:  ESOPHAGOGASTRODUODENOSCOPY (EGD) WITH PROPOFOL; N/A     Comment:  Procedure: ESOPHAGOGASTRODUODENOSCOPY (EGD) WITH               PROPOFOL;  Surgeon: Manya Silvas, MD;  Location:               [facility] ENDOSCOPY;  Service: Endoscopy;  Laterality: N/A; 1995: [place], 2008: NASAL SINUS SURGERY 1950: TONSILLECTOMY No date: VAGINAL DELIVERY     Comment:  x 3  BMI    Body Mass Index: 21.41 kg/m      Reproductive/Obstetrics negative OB ROS                             Anesthesia Physical Anesthesia Plan  ASA: 3  Anesthesia Plan: General   Post-op Pain Management:    Induction: Intravenous  PONV Risk Score and Plan: Propofol infusion and TIVA  Airway Management Planned: Natural Airway  Additional Equipment:   Intra-op Plan:   Post-operative Plan:   Informed Consent: I have reviewed the patients History and Physical, chart, labs and discussed the procedure including the risks, benefits and alternatives for the proposed anesthesia with the patient or authorized representative who has indicated his/her understanding and acceptance.     Dental Advisory Given  Plan Discussed with: CRNA  and Surgeon  Anesthesia Plan Comments:        Anesthesia Quick Evaluation

## 2023-02-17 NOTE — Op Note (Signed)
[facility] Gastroenterology Patient Name: Natalie [name] Procedure Date: 02/17/2023 12:22 PM MRN: [id number] Account #: [id number] Date of Birth: 05/15/1946 Admit Type: Outpatient Age: 77 Room: [facility] ENDO ROOM 2 Gender: Female Note Status: Finalized Instrument Name: Park Meo [id number] Procedure:             Colonoscopy Indications:           High risk colon cancer surveillance: Personal history                         of colonic polyps Providers:             Benay Pike. Alice Reichert MD, MD Referring MD:          Ocie Cornfield. Ouida Sills MD, MD (Referring MD) Medicines:             Propofol per Anesthesia Complications:         No immediate complications. Procedure:             Pre-Anesthesia Assessment:                        - The risks and benefits of the procedure and the                         sedation options and risks were discussed with the                         patient. All questions were answered and informed                         consent was obtained.                        - Patient identification and proposed procedure were                         verified prior to the procedure by the nurse. The                         procedure was verified in the procedure room.                        - ASA Grade Assessment: III - A patient with severe                         systemic disease.                        - After reviewing the risks and benefits, the patient                         was deemed in satisfactory condition to undergo the                         procedure.                        After obtaining informed consent, the colonoscope was                         passed  under direct vision. Throughout the procedure,                         the patient's blood pressure, pulse, and oxygen                         saturations were monitored continuously. The                         Colonoscope was introduced through the anus and                          advanced to the the cecum, identified by appendiceal                         orifice and ileocecal valve. The colonoscopy was                         performed without difficulty. The patient tolerated                         the procedure well. The quality of the bowel                         preparation was adequate. The ileocecal valve,                         appendiceal orifice, and rectum were photographed. Findings:      The perianal exam findings include internal hemorrhoids that prolapse       with straining, but require manual replacement into the anal canal       (Grade III).      Many medium-mouthed diverticula were found in the sigmoid colon. There       was no evidence of diverticular bleeding.      The exam was otherwise without abnormality on direct and retroflexion       views. Impression:            - Internal hemorrhoids that prolapse with straining,                         but require manual replacement into the anal canal                         (Grade III) found on perianal exam.                        - Mild diverticulosis in the sigmoid colon. There was                         no evidence of diverticular bleeding.                        - The examination was otherwise normal on direct and                         retroflexion views.                        - No specimens collected. Recommendation:        -  Patient has a contact number available for                         emergencies. The signs and symptoms of potential                         delayed complications were discussed with the patient.                         Return to normal activities tomorrow. Written                         discharge instructions were provided to the patient.                        - Resume previous diet.                        - Continue present medications.                        - No repeat colonoscopy due to current age (26 years                         or older) and the absence  of colonic polyps.                        - You do NOT require further colon cancer screening                         measures (Annual stool testing (i.e. hemoccult, FIT,                         cologuard), sigmoidoscopy, colonoscopy or CT                         colonography). You should share this recommendation                         with your Primary Care provider.                        - Return to GI office PRN.                        - The findings and recommendations were discussed with                         the patient. Procedure Code(s):     --- Professional ---                        [id number], Colorectal cancer screening; colonoscopy on                         individual at high risk Diagnosis Code(s):     --- Professional ---                        K57.30, Diverticulosis of large intestine without  perforation or abscess without bleeding                        K64.2, Third degree hemorrhoids                        Z86.010, Personal history of colonic polyps CPT copyright 2022 American Medical Association. All rights reserved. The codes documented in this report are preliminary and upon coder review may  be revised to meet current compliance requirements. Efrain Sella MD, MD 02/17/2023 12:57:04 PM This report has been signed electronically. Number of Addenda: 0 Note Initiated On: 02/17/2023 12:22 PM Scope Withdrawal Time: 0 hours 5 minutes 42 seconds  Total Procedure Duration: 0 hours 12 minutes 2 seconds  Estimated Blood Loss:  Estimated blood loss: none.      [facility]

## 2023-02-17 NOTE — Anesthesia Postprocedure Evaluation (Signed)
Anesthesia Post Note  Patient: Natalie [name]  Procedure(s) Performed: COLONOSCOPY  Patient location during evaluation: PACU Anesthesia Type: General Level of consciousness: awake Pain management: satisfactory to patient Vital Signs Assessment: post-procedure vital signs reviewed and stable Respiratory status: spontaneous breathing Cardiovascular status: stable Anesthetic complications: no   No notable events documented.   Last Vitals:  Vitals:   02/17/23 1306 02/17/23 1316  BP: 114/60 95/70  Pulse:  79  Resp:    Temp:    SpO2:  96%    Last Pain:  Vitals:   02/17/23 1316  TempSrc:   PainSc: 0-No pain                 VAN STAVEREN,Kenlee Vogt

## 2023-02-18 ENCOUNTER — Encounter: Payer: Self-pay | Admitting: Internal Medicine

## 2023-03-19 NOTE — Interval H&P Note (Signed)
History and Physical Interval Note:  03/19/2023 1:39 PM  Natalie [name]  has presented today for surgery, with the diagnosis of H/O adenomatous polyp of colon (Z86.010).  The various methods of treatment have been discussed with the patient and family. After consideration of risks, benefits and other options for treatment, the patient has consented to  Procedure(s): COLONOSCOPY (N/A) as a surgical intervention.  The patient's history has been reviewed, patient examined, no change in status, stable for surgery.  I have reviewed the patient's chart and labs.  Questions were answered to the patient's satisfaction.     [place], [place]

## 2023-03-19 NOTE — H&P (Signed)
Outpatient short stay form Pre-procedure 03/19/2023 1:38 PM Natalie [name], M.D.  Primary Physician: Einar Crow, M.D.  Reason for visit:  Personal history of adenomatous (sessile serrated adenomatous polyp -March 2018).  History of present illness:                            Patient presents for colonoscopy for a personal hx of colon polyps. The patient denies abdominal pain, abnormal weight loss or rectal bleeding.     No current facility-administered medications for this encounter.  Current Outpatient Medications:    acyclovir (ZOVIRAX) 800 MG tablet, Take 800 mg by mouth 2 (two) times daily. (Patient not taking: Reported on 12/01/2022), Disp: , Rfl:    acyclovir ointment (ZOVIRAX) 5 %, Apply topically as needed. (Patient not taking: Reported on 12/01/2022), Disp: , Rfl:    aspirin EC 81 MG tablet, Take 81 mg by mouth at bedtime. , Disp: , Rfl:    Azelastine HCl 137 MCG/SPRAY SOLN, Place 2 sprays into both nostrils 2 (two) times daily., Disp: , Rfl:    baclofen (LIORESAL) 10 MG tablet, Take 10 mg by mouth at bedtime. , Disp: , Rfl:    budesonide-formoterol (SYMBICORT) 160-4.5 MCG/ACT inhaler, INHALE 2 PUFFS INTO THE LUNGS DAILY, Disp: 10.2 g, Rfl: 12   Cholecalciferol (VITAMIN D3) 25 MCG (1000 UT) CAPS, Take 1,000 Units by mouth daily in the afternoon., Disp: , Rfl:    diphenhydramine-acetaminophen (TYLENOL PM) 25-500 MG TABS tablet, Take by mouth at bedtime., Disp: , Rfl:    EPIPEN 2-PAK 0.3 MG/0.3ML SOAJ injection, Inject 0.3 mLs into the skin as needed (allergic reactions).  (Patient not taking: Reported on 12/01/2022), Disp: , Rfl: 1   estradiol (ESTRACE) 0.5 MG tablet, TAKE 1 TABLET(0.5 MG) BY MOUTH DAILY, Disp: 90 tablet, Rfl: 0   fexofenadine (ALLEGRA) 180 MG tablet, Take 180 mg by mouth daily., Disp: , Rfl:    fluticasone (FLONASE) 50 MCG/ACT nasal spray, Place 2 sprays into both nostrils daily., Disp: , Rfl:    Na Sulfate-K Sulfate-Mg Sulf 17.5-3.13-1.6 GM/177ML SOLN,  Take by mouth. (Patient not taking: Reported on 12/01/2022), Disp: , Rfl:    omeprazole (PRILOSEC) 20 MG capsule, Take 20 mg by mouth 2 (two) times daily., Disp: , Rfl:    rosuvastatin (CRESTOR) 10 MG tablet, Take 1 tablet (10 mg total) by mouth 2 (two) times a week., Disp: 30 tablet, Rfl: 1   vitamin B-12 (CYANOCOBALAMIN) 1000 MCG tablet, Take 1,000 mcg by mouth daily., Disp: , Rfl:   No medications prior to admission.     Allergies  Allergen Reactions   Aspirin Shortness Of Breath and Nausea And Vomiting    Can not take full strength aspirin b/c gets very congested in throat.  Can take an enteric coated baby aspirin EC Maximum Strength EC Maximum Strength Can not take full strength aspirin b/c gets very congested in throat.  Can take an enteric coated baby aspirin EC Maximum Strength Can not take full strength aspirin b/c gets very congested in throat.  Can take an enteric coated baby aspirin EC Maximum Strength    Amoxicillin-Pot Clavulanate Nausea And Vomiting, Rash, Diarrhea and Nausea Only    Other reaction(s): Nausea And Vomiting, Vomiting Has patient had a PCN reaction causing immediate rash, facial/tongue/throat swelling, SOB or lightheadedness with hypotension: Yes Has patient had a PCN reaction causing severe rash involving mucus membranes or skin necrosis: No Has patient had a PCN reaction that  required hospitalization No Has patient had a PCN reaction occurring within the last 10 years: No If all of the above answers are "NO", then may proceed with Cephalosporin use. Has patient had a PCN reaction causing immediate rash, facial/tongue/throat swelling, SOB or lightheadedness with hypotension: Yes Has patient had a PCN reaction causing severe rash involving mucus membranes or skin necrosis: No Has patient had a PCN reaction that required hospitalization No Has patient had a PCN reaction occurring within the last 10 years: No If all of the above answers are "NO", then may  proceed with Cephalosporin use.  Has patient had a PCN reaction causing immediate rash, facial/tongue/throat swelling, SOB or lightheadedness with hypotension: Yes Has patient had a PCN reaction causing severe rash involving mucus membranes or skin necrosis: No Has patient had a PCN reaction that required hospitalization No Has patient had a PCN reaction occurring within the last 10 years: No If all of the above answers are "NO", then may proceed with Cephalosporin use. Other reaction(s): Nausea And Vomiting, Vomiting Has patient had a PCN reaction causing immediate rash, facial/tongue/throat swelling, SOB or lightheadedness with hypotension: Yes Has patient had a PCN reaction causing severe rash involving mucus membranes or skin necrosis: No Has patient had a PCN reaction that required hospitalization No Has patient had a PCN reaction occurring within the last 10 years: No If all of the above answers are "NO", then may proceed with Cephalosporin use. Has patient had a PCN reaction causing immediate rash, facial/tongue/throat swelling, SOB or lightheadedness with hypotension: Yes Has patient had a PCN reaction causing severe rash involving mucus membranes or skin necrosis: No Has patient had a PCN reaction that required hospitalization No Has patient had a PCN reaction occurring within the last 10 years: No If all of the above answers are "NO", then may proceed with Cephalosporin use.   Atorvastatin Other (See Comments)    myalgias Other reaction(s): Other (See Comments) myalgias   Clindamycin/Lincomycin Other (See Comments)   Codeine Nausea And Vomiting   Tramadol Nausea And Vomiting   Carisoprodol Nausea And Vomiting   Clarithromycin Nausea And Vomiting     Past Medical History:  Diagnosis Date   Allergy    Chest pain    Colon polyps    Coronary artery disease    Gastric ulcer    GERD (gastroesophageal reflux disease)    Hypertension    Nasal polyps    followed by Dr Jenne CampusMcQueen     Review of systems:  Otherwise negative.    Physical Exam  Gen: Alert, oriented. Appears stated age.  HEENT: /AT. PERRLA. Lungs: CTA, no wheezes. CV: RR nl S1, S2. Abd: soft, benign, no masses. BS+ Ext: No edema. Pulses 2+    Planned procedures: Proceed with colonoscopy. The patient understands the nature of the planned procedure, indications, risks, alternatives and potential complications including but not limited to bleeding, infection, perforation, damage to internal organs and possible oversedation/side effects from anesthesia. The patient agrees and gives consent to proceed.  Please refer to procedure notes for findings, recommendations and patient disposition/instructions.     Malone Vanblarcom K. Norma Fredricksonoledo, M.D. Gastroenterology 03/19/2023  1:38 PM

## 2023-05-28 ENCOUNTER — Other Ambulatory Visit: Payer: Self-pay

## 2023-05-28 ENCOUNTER — Emergency Department
Admission: EM | Admit: 2023-05-28 | Discharge: 2023-05-28 | Disposition: A | Discharge status: Home / Self Care | Payer: Medicare Other | Attending: Emergency Medicine | Admitting: Emergency Medicine

## 2023-05-28 ENCOUNTER — Encounter: Payer: Self-pay | Admitting: Emergency Medicine

## 2023-05-28 DIAGNOSIS — R252 Cramp and spasm: Secondary | ICD-10-CM | POA: Diagnosis not present

## 2023-05-28 DIAGNOSIS — I10 Essential (primary) hypertension: Secondary | ICD-10-CM | POA: Diagnosis not present

## 2023-05-28 DIAGNOSIS — R55 Syncope and collapse: Secondary | ICD-10-CM | POA: Diagnosis present

## 2023-05-28 DIAGNOSIS — R42 Dizziness and giddiness: Secondary | ICD-10-CM

## 2023-05-28 DIAGNOSIS — E860 Dehydration: Secondary | ICD-10-CM | POA: Diagnosis not present

## 2023-05-28 LAB — URINALYSIS, ROUTINE W REFLEX MICROSCOPIC
Bacteria, UA: NONE SEEN
Bilirubin Urine: NEGATIVE
Glucose, UA: NEGATIVE mg/dL
Hgb urine dipstick: NEGATIVE
Ketones, ur: NEGATIVE mg/dL
Nitrite: NEGATIVE
Protein, ur: NEGATIVE mg/dL
Specific Gravity, Urine: 1.019 (ref 1.005–1.030)
pH: 5 (ref 5.0–8.0)

## 2023-05-28 LAB — BASIC METABOLIC PANEL
Anion gap: 6 (ref 5–15)
BUN: 12 mg/dL (ref 8–23)
CO2: 26 mmol/L (ref 22–32)
Calcium: 8.9 mg/dL (ref 8.9–10.3)
Chloride: 108 mmol/L (ref 98–111)
Creatinine, Ser: 0.65 mg/dL (ref 0.44–1.00)
GFR, Estimated: >60 mL/min (ref >60)
Glucose, Bld: 139 mg/dL — ABNORMAL HIGH (ref 70–99)
Potassium: 3.7 mmol/L (ref 3.5–5.1)
Sodium: 140 mmol/L (ref 135–145)

## 2023-05-28 LAB — TROPONIN I (HIGH SENSITIVITY): Troponin I (High Sensitivity): 5 ng/L (ref <18)

## 2023-05-28 LAB — CBC
HCT: 33.2 % — ABNORMAL LOW (ref 36.0–46.0)
Hemoglobin: 10.6 g/dL — ABNORMAL LOW (ref 12.0–15.0)
MCH: 31.3 pg (ref 26.0–34.0)
MCHC: 31.9 g/dL (ref 30.0–36.0)
MCV: 97.9 fL (ref 80.0–100.0)
Platelets: 307 10*3/uL (ref 150–400)
RBC: 3.39 MIL/uL — ABNORMAL LOW (ref 3.87–5.11)
RDW: 11.9 % (ref 11.5–15.5)
WBC: 7.7 10*3/uL (ref 4.0–10.5)
nRBC: 0 % (ref 0.0–0.2)

## 2023-05-28 LAB — CBG MONITORING, ED: Glucose-Capillary: 114 mg/dL — ABNORMAL HIGH (ref 70–99)

## 2023-05-28 NOTE — ED Provider Notes (Signed)
[facility] Provider Note    Event Date/Time   First MD Initiated Contact with Patient 05/28/23 1742     (approximate)   History   Loss of Consciousness   HPI  Natalie [name] is a 77 y.o. female   Past medical history of hypertension, presents to the emergency department with a syncopal episode at the [employer] while eating lunch today.  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  She feels she has not been hydrating well.  Today at lunch she began to have a thigh cramp which is not unusual for her after extensive exercise she stood up fast and felt lightheaded sat back down in the chair and passed out.  This was witnessed by several others.  Regained consciousness within minutes.  No seizure-like activity.  EMS gave her some IV fluids en route and now she feels better.  She denies any other recent illnesses, respiratory infectious symptoms, GI complaints, GU complaints.  No trauma from her fall per patient and family members here who witnessed the event.  She was able to ambulate to the bathroom today and is now asymptomatic.  During the episode she specifically denies chest pain, shortness of breath, palpitations.  Independent Historian contributed to assessment above: Both her grandson and husband are at bedside to corroborate information given above      Physical Exam   Triage Vital Signs: ED Triage Vitals  Enc Vitals Group     BP 05/28/23 1553 128/62     Pulse Rate 05/28/23 1553 66     Resp 05/28/23 1553 18     Temp 05/28/23 1553 97.9 F (36.6 C)     Temp Source 05/28/23 1553 Oral     SpO2 05/28/23 1553 96 %     Weight --      Height --      Head Circumference --      Peak Flow --      Pain Score 05/28/23 1548 0     Pain Loc --      Pain Edu? --      Excl. in GC? --     Most recent vital  signs: Vitals:   05/28/23 1553  BP: 128/62  Pulse: 66  Resp: 18  Temp: 97.9 F (36.6 C)  SpO2: 96%    General: Awake, no distress.  CV:  Good peripheral perfusion.  Resp:  Normal effort.  Abd:  No distention.  Other:  Awake alert comfortable appearing with normal hemodynamics no focal neurologic deficits including dysarthria facial asymmetry or motor or sensory deficits.  Skin appears warm well-perfused.  Mildly dry mucous membranes appears slightly dehydrated.   ED Results / Procedures / Treatments   Labs (all labs ordered are listed, but only abnormal results are displayed) Labs Reviewed  BASIC METABOLIC PANEL - Abnormal; Notable for the following components:      Result Value   Glucose, Bld 139 (*)    All other components within normal limits  CBC - Abnormal; Notable for the following components:   RBC 3.39 (*)    Hemoglobin 10.6 (*)    HCT 33.2 (*)    All other components within normal limits  URINALYSIS, ROUTINE W REFLEX MICROSCOPIC - Abnormal; Notable for the following components:   Color, Urine YELLOW (*)  APPearance HAZY (*)    Leukocytes,Ua TRACE (*)    All other components within normal limits  CBG MONITORING, ED - Abnormal; Notable for the following components:   Glucose-Capillary 114 (*)    All other components within normal limits  TROPONIN I (HIGH SENSITIVITY)     I ordered and reviewed the above labs they are notable for H&H is at baseline electrolytes within normal limits.  EKG  ED ECG REPORT I, Pilar Jarvis, the attending physician, personally viewed and interpreted this ECG.   Date: 05/28/2023  EKG Time: 1559  Rate: 72  Rhythm: nsr  Axis: nl  Intervals:none  ST&T Change: no stemi   PROCEDURES:  Critical Care performed: No  Procedures   MEDICATIONS ORDERED IN ED: Medications - No data to display  IMPRESSION / MDM / ASSESSMENT AND PLAN / ED COURSE  I reviewed the triage vital signs and the nursing notes.                                 Patient's presentation is most consistent with acute presentation with potential threat to life or bodily function.  Differential diagnosis includes, but is not limited to, vasovagal syncope, orthostatic lightheadedness, orthostatic hypotension, dehydration, AKI, electrolyte disturbance, dysrhythmia, ACS, cva   MDM:    Most likely a combination of orthostatic lightheadedness from hypovolemia in the setting of extensive physical exertion over the last 2 days with poor p.o. intake and hydration combined with vasovagal syncope in the setting of leg cramping pain.  She feels better now after IV hydration given by EMS.  I considered ACS dysrhythmia or electrolyte derangements but I think this is less likely given no chest pain, palpitations and lab analysis today within normal limits.  No focal neurologic deficits to suggest CVA and symptoms completely resolved now.  I offered further IV hydration but the patient feels well no nausea and can tolerate p.o. so would like to hydrate at home and be discharged at this time.  Given unremarkable workup as above, plan will be for discharge and close PMD follow-up and return precautions.         FINAL CLINICAL IMPRESSION(S) / ED DIAGNOSES   Final diagnoses:  Syncope and collapse  Dehydration  Orthostatic lightheadedness  Leg cramping     Rx / DC Orders   ED Discharge Orders     None        Note:  This document was prepared using Dragon voice recognition software and may include unintentional dictation errors.    Pilar Jarvis, MD 05/28/23 Windell Moment

## 2023-05-28 NOTE — ED Notes (Signed)
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 Believes she over did it, and didn't hydrate or eat sufficiently". Family x2 at [facility].

## 2023-05-28 NOTE — ED Notes (Signed)
First Nurse Note: Patient to ED via [facility] from the [employer] for a syncopal episode.  EMS gave of NacL- 20 L AC  EMS VS: 159 cbg 75 HR 136/89

## 2023-05-28 NOTE — ED Notes (Signed)
EDP into room, at BS.  ?

## 2023-05-28 NOTE — Discharge Instructions (Signed)
Drink plenty of fluids and stay well-hydrated.  Find Pedialyte or similar electrolyte rehydration formula at your local pharmacy and drink throughout the day.  Call your doctor for follow-up appointment this week.  If you experience any new, worsening, or unexpected symptoms call your doctor right away or return to the emergency department.

## 2023-06-02 ENCOUNTER — Encounter: Payer: Self-pay | Admitting: Cardiovascular Disease

## 2023-06-02 DIAGNOSIS — R072 Precordial pain: Secondary | ICD-10-CM

## 2023-06-04 ENCOUNTER — Telehealth: Payer: Self-pay | Admitting: Cardiovascular Disease

## 2023-06-04 MED ORDER — METOPROLOL TARTRATE 25 MG PO TABS: ORAL_TABLET | ORAL | 0 refills | Status: AC

## 2023-06-04 NOTE — Telephone Encounter (Signed)
Last message re-sent to patient via My Chart

## 2023-06-04 NOTE — Telephone Encounter (Signed)
Pt is requesting to have the last MyChart message that was sent to her this morning from [employer]. She stated she deleted it by accident and would like to have it again. Please advise

## 2023-07-08 ENCOUNTER — Other Ambulatory Visit: Payer: Self-pay | Admitting: Cardiovascular Disease

## 2023-07-08 ENCOUNTER — Ambulatory Visit
Admission: RE | Admit: 2023-07-08 | Discharge: 2023-07-08 | Disposition: A | Discharge status: Home / Self Care | Payer: Medicare Other | Source: Ambulatory Visit | Attending: Cardiovascular Disease | Admitting: Cardiovascular Disease

## 2023-07-08 DIAGNOSIS — I2510 Atherosclerotic heart disease of native coronary artery without angina pectoris: Secondary | ICD-10-CM

## 2023-07-08 DIAGNOSIS — R931 Abnormal findings on diagnostic imaging of heart and coronary circulation: Secondary | ICD-10-CM | POA: Insufficient documentation

## 2023-07-08 DIAGNOSIS — R072 Precordial pain: Secondary | ICD-10-CM | POA: Insufficient documentation

## 2023-07-08 MED ORDER — SODIUM CHLORIDE 0.9 % IV BOLUS
150.0000 mL | Freq: Once | INTRAVENOUS | Status: AC
  Administered 2023-07-08: 150 mL via INTRAVENOUS

## 2023-07-08 MED ORDER — IOHEXOL 350 MG/ML SOLN
75.0000 mL | Freq: Once | INTRAVENOUS | Status: AC | PRN
  Administered 2023-07-08: 75 mL via INTRAVENOUS

## 2023-07-08 MED ORDER — METOPROLOL TARTRATE 5 MG/5ML IV SOLN: 10.0000 mg | Freq: Once | INTRAVENOUS | Status: DC

## 2023-07-08 MED ORDER — NITROGLYCERIN 0.4 MG SL SUBL
0.8000 mg | SUBLINGUAL_TABLET | Freq: Once | SUBLINGUAL | Status: AC
  Administered 2023-07-08: 0.8 mg via SUBLINGUAL

## 2023-07-08 NOTE — Progress Notes (Signed)
Patient tolerated procedure well. Ambulate w/o difficulty. Denies light headedness or being dizzy. Sitting in chair drinking water provided. Encouraged to drink extra water today and reasoning explained. Verbalized understanding. All questions answered. ABC intact. No further needs. Discharge from procedure area w/o issues.   °

## 2023-07-16 ENCOUNTER — Encounter: Payer: Self-pay | Admitting: Cardiovascular Disease

## 2023-10-14 ENCOUNTER — Encounter: Payer: Self-pay | Admitting: Cardiovascular Disease

## 2023-10-14 MED ORDER — ROSUVASTATIN CALCIUM 10 MG PO TABS: 10.0000 mg | ORAL_TABLET | ORAL | 3 refills | Status: DC

## 2023-10-14 NOTE — Telephone Encounter (Signed)
Prescription for Crestor sent to [facility] per request.

## 2024-02-09 ENCOUNTER — Ambulatory Visit: Payer: Medicare Other | Attending: Cardiovascular Disease | Admitting: Cardiovascular Disease

## 2024-02-09 ENCOUNTER — Encounter: Payer: Self-pay | Admitting: Cardiovascular Disease

## 2024-02-09 VITALS — BP 138/60 | HR 83 | Wt 105.2 lb

## 2024-02-09 DIAGNOSIS — E7800 Pure hypercholesterolemia, unspecified: Secondary | ICD-10-CM | POA: Insufficient documentation

## 2024-02-09 DIAGNOSIS — R7303 Prediabetes: Secondary | ICD-10-CM | POA: Insufficient documentation

## 2024-02-09 DIAGNOSIS — Z87898 Personal history of other specified conditions: Secondary | ICD-10-CM | POA: Insufficient documentation

## 2024-02-09 NOTE — Patient Instructions (Signed)
 Medication Instructions:   Your physician recommends that you continue on your current medications as directed. Please refer to the Current Medication list given to you today.   *If you need a refill on your cardiac medications before your next appointment, please call your pharmacy*   Lab Work: NONE    If you have labs (blood work) drawn today and your tests are completely normal, you will receive your results only by: MyChart Message (if you have MyChart) OR A paper copy in the mail If you have any lab test that is abnormal or we need to change your treatment, we will call you to review the results.   Testing/Procedures: NONE    Follow-Up: At [facility], you and your health needs are our priority.  As part of our continuing mission to provide you with exceptional heart care, we have created designated Provider Care Teams.  These Care Teams include your primary Cardiologist (physician) and Advanced Practice Providers (APPs -  Physician Assistants and Nurse Practitioners) who all work together to provide you with the care you need, when you need it.  We recommend signing up for the patient portal called "MyChart".  Sign up information is provided on this After Visit Summary.  MyChart is used to connect with patients for Virtual Visits (Telemedicine).  Patients are able to view lab/test results, encounter notes, upcoming appointments, etc.  Non-urgent messages can be sent to your provider as well.   To learn more about what you can do with MyChart, go to [email or web address].    Your next appointment:   1 year(s)  The format for your next appointment:   In Person  Provider:   Thurmon Fair, MD    Other Instructions

## 2024-02-09 NOTE — Progress Notes (Signed)
 Cardiology Office Note:    Date:  02/09/2024   ID:  Natalie [name], DOB Jan 06, 1946, MRN [id number]  PCP:  Enid Baas, MD  Cardiologist:  Thurmon Fair, MD  Electrophysiologist:  None   Referring MD: Enid Baas, MD   No chief complaint on file.    History of Present Illness:    Natalie [name] is a 78 y.o. female with a family history of coronary artery disease with complaints of chest pain and dyspnea improved after diagnosis of eosinophilic esophagitis with spasm and appropriate therapy.  Coronary angiography in 2017 did not show meaningful coronary artery disease and she has normal left ventricular systolic function and normal heart filling pressures.  She remains very physically active.  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  She has not had any problems with shortness of breath or chest pain at rest or with activity,  She is very physically active.  She continues to teach senior exercise classes at the [facility] in [place] 5 days a week, each session lasting an hour.  She had a syncopal event due to insufficient hydration in June.  This has not recurred since then.  Labs are performed 04/01/2023 at [facility].  On rosuvastatin, cholesterol 147, triglycerides 49, HDL 79, LDL 58, Hemoglobin A1c 5.9% more recently on 10/01/2019 for a routine chemistry panel was completely normal with a creatinine of 0.7 and potassium 4.4.  Past Medical History:  Diagnosis Date   Allergy    Chest pain    Colon polyps    Coronary artery disease    Gastric ulcer    GERD (gastroesophageal reflux disease)    Hypertension    Nasal polyps    followed by Dr Jenne Campus    Past Surgical History:  Procedure Laterality Date   ABDOMINAL HYSTERECTOMY  1984   due to fibroids; have ovaries.    APPENDECTOMY  1952   CARDIAC CATHETERIZATION N/A 10/22/2016   Procedure: Right/Left Heart Cath and Coronary Angiography;  Surgeon: Lennette Bihari, MD;  Location:  [facility] INVASIVE CV LAB;  Service: Cardiovascular;  Laterality: N/A;   COLONOSCOPY N/A 02/17/2023   Procedure: COLONOSCOPY;  Surgeon: Toledo, Boykin Nearing, MD;  Location: [facility] ENDOSCOPY;  Service: Gastroenterology;  Laterality: N/A;   COLONOSCOPY WITH PROPOFOL N/A 03/03/2017   Procedure: COLONOSCOPY WITH PROPOFOL;  Surgeon: Scot Jun, MD;  Location: [facility] ENDOSCOPY;  Service: Endoscopy;  Laterality: N/A;   ESOPHAGOGASTRODUODENOSCOPY (EGD) WITH PROPOFOL N/A 08/12/2018   Procedure: ESOPHAGOGASTRODUODENOSCOPY (EGD) WITH PROPOFOL;  Surgeon: Scot Jun, MD;  Location: [facility] ENDOSCOPY;  Service: Endoscopy;  Laterality: N/A;   HEMORROIDECTOMY  1995   NASAL SINUS SURGERY  1988, 2008   TONSILLECTOMY  1950   VAGINAL DELIVERY     x 3    Current Medications: No outpatient medications have been marked as taking for the 02/09/24 encounter (Office Visit) with Duell Holdren, Rachelle Hora, MD.     Allergies:   Aspirin, Amoxicillin-pot clavulanate, Atorvastatin, Clindamycin/lincomycin, Codeine, Tramadol, Carisoprodol, and Clarithromycin     Family History: The patient's family history includes COPD in her paternal grandfather and sister; COPD (age of onset: 29) in her father; Cancer in her paternal grandmother; Cancer (age of onset: 70) in her daughter; Heart disease in her maternal grandfather and maternal grandmother; Heart disease (age of onset: 63) in her brother; Heart disease (age of onset: 87) in her mother; Pneumonia (age of onset: 70) in her sister. There is no history of Breast cancer.  ROS:  Please see the history of present illness.     All other systems reviewed and are negative.  EKGs/Labs/Other Studies Reviewed:    The following studies were reviewed today: Labs and notes from primary care provider.  EKG:   EKG Interpretation Date/Time:  Wednesday February 09 2024 15:49:58 EST Ventricular Rate:  83 PR Interval:  142 QRS Duration:  74 QT Interval:  422 QTC Calculation: 495 R  Axis:   39  Text Interpretation: Sinus rhythm with frequent Premature ventricular complexes Possible Left atrial enlargement Prolonged QT When compared with ECG of 28-May-2023 15:59, Premature ventricular complexes are now Present and are probably of outflow tract origin Confirmed by Jovanni Eckhart [phone]) on 02/11/2024 4:11:02 PM         Recent Labs: Glucose 79, potassium 4.0, creatinine 0.7, normal liver function tests, albumin 4.1,  Recent Lipid Panel    Component Value Date/Time   CHOL 214 (H) 03/10/2016 1019   TRIG 70.0 03/10/2016 1019   HDL 81.80 03/10/2016 1019   CHOLHDL 3 03/10/2016 1019   VLDL 14.0 03/10/2016 1019   LDLCALC 118 (H) 03/10/2016 1019   December 26, 2019 Total cholesterol 140, HDL 78, LDL 53, triglycerides 45 Creatinine 0.6, potassium 4.4, normal liver function tests  October 07, 2022 cholesterol 145, triglycerides 65, LDL 54, HDL 78. Potassium 4.3, creatinine 0.7, normal liver function test, TSH 1.47, hemoglobin 11.0  [facility]/01/2023 at [facility].  Cholesterol 147, triglycerides 49, HDL 79, LDL 58, Hemoglobin A1c 5.9% \ 10/01/2019 routine chemistry panel was completely normal with a creatinine of 0.7 and potassium 4.4.  Physical Exam:    VS:  There were no vitals taken for this visit.    Wt Readings from Last 3 Encounters:  02/17/23 106 lb (48.1 kg)  12/01/22 108 lb 6.4 oz (49.2 kg)  06/19/21 109 lb 9.6 oz (49.7 kg)      General: Alert, oriented x3, no distress, lean, fit, younger than stated age Head: no evidence of trauma, PERRL, EOMI, no exophtalmos or lid lag, no myxedema, no xanthelasma; normal ears, nose and oropharynx Neck: normal jugular venous pulsations and no hepatojugular reflux; brisk carotid pulses without delay and no carotid bruits Chest: clear to auscultation, no signs of consolidation by percussion or palpation, normal fremitus, symmetrical and full respiratory excursions Cardiovascular: normal position and quality of the apical impulse,  regular rhythm, normal first and second heart sounds, no murmurs, rubs or gallops Abdomen: no tenderness or distention, no masses by palpation, no abnormal pulsatility or arterial bruits, normal bowel sounds, no hepatosplenomegaly Extremities: no clubbing, cyanosis or edema; 2+ radial, ulnar and brachial pulses bilaterally; 2+ right femoral, posterior tibial and dorsalis pedis pulses; 2+ left femoral, posterior tibial and dorsalis pedis pulses; no subclavian or femoral bruits Neurological: grossly nonfocal Psych: Normal mood and affect\    ASSESSMENT:    1. Hypercholesterolemia      PLAN:    In order of problems listed above:  Chest pain: No recent complaints.  This has resolved and it was related to esophageal spasm/eosinophilic esophagitis.  No evidence of CAD by coronary angiography in 2017. HLP: Excellent lipid parameters on rosuvastatin with healthy diet and active lifestyle.  Despite her very lean body habitus and excellent physical activity she still has A1c in prediabetes range. History of syncope: hypotensive, likely due to hypovolemia.  No recurrence    Medication Adjustments/Labs and Tests Ordered: Current medicines are reviewed at length with the patient today.  Concerns regarding medicines are outlined above.  Orders Placed  This Encounter  Procedures   EKG 12-Lead    No orders of the defined types were placed in this encounter.    There are no Patient Instructions on file for this visit.   Signed, Thurmon Fair, MD  02/09/2024 3:37 PM    [facility]

## 2024-02-13 ENCOUNTER — Encounter: Payer: Self-pay | Admitting: Cardiovascular Disease

## 2024-03-01 ENCOUNTER — Ambulatory Visit: Payer: Medicare Other | Admitting: Cardiovascular Disease

## 2024-04-12 ENCOUNTER — Other Ambulatory Visit: Payer: Self-pay | Admitting: Internal Medicine

## 2024-04-12 DIAGNOSIS — Z1231 Encounter for screening mammogram for malignant neoplasm of breast: Secondary | ICD-10-CM

## 2024-05-22 ENCOUNTER — Other Ambulatory Visit: Payer: Self-pay | Admitting: Cardiovascular Disease

## 2024-11-21 ENCOUNTER — Telehealth: Payer: Self-pay | Admitting: Cardiovascular Disease

## 2024-11-21 NOTE — Telephone Encounter (Signed)
 Patient stated she recently had an echocardiogram at her PCP's office and they will be sending over the results for Dr. Tyrone review.

## 2024-11-21 NOTE — Telephone Encounter (Signed)
 Spoke with patient and informed her we received her message. Patient states her PCP performed an EKG on her and this prompted them to order the echo.  Echo results available via Care Everywhere. Will forward to Dr. Francyne to review.

## 2024-11-22 NOTE — Telephone Encounter (Signed)
 Please order a 7-day ZIO monitor for frequent PVCs and make her a follow-up appointment with me in February.

## 2024-11-23 ENCOUNTER — Ambulatory Visit: Attending: Cardiovascular Disease

## 2024-11-23 DIAGNOSIS — I493 Ventricular premature depolarization: Secondary | ICD-10-CM

## 2024-11-23 NOTE — Progress Notes (Unsigned)
 Enrolled patient for a 7 day Zio XT monitor to be mailed to patients home.

## 2024-11-23 NOTE — Telephone Encounter (Signed)
 S/w the patient- she is aware that she will receive a monitor to wear for 7 days.   Appt made in February for f/u  Will also send directions for monitor in mychart. She verbalized understanding.

## 2024-12-18 ENCOUNTER — Other Ambulatory Visit: Payer: Self-pay | Admitting: Cardiovascular Disease

## 2024-12-20 ENCOUNTER — Ambulatory Visit: Payer: Self-pay | Admitting: Cardiovascular Disease

## 2024-12-20 DIAGNOSIS — I493 Ventricular premature depolarization: Secondary | ICD-10-CM | POA: Diagnosis not present

## 2024-12-21 MED ORDER — METOPROLOL SUCCINATE ER 25 MG PO TB24: 25.0000 mg | ORAL_TABLET | Freq: Every day | ORAL | 1 refills | Status: AC

## 2024-12-21 NOTE — Telephone Encounter (Signed)
Patient is returning RN's call.

## 2025-01-14 DEATH — deceased

## 2025-02-01 ENCOUNTER — Ambulatory Visit: Admitting: Cardiovascular Disease
